# Patient Record
Sex: Female | Born: 1960 | Race: White | Hispanic: No | Marital: Married | Smoking: Never smoker
Health system: Southern US, Community
[De-identification: ages and names within clinical notes are randomized; demographics above are authoritative.]

## PROBLEM LIST (undated history)

## (undated) DIAGNOSIS — G4730 Sleep apnea, unspecified: Secondary | ICD-10-CM

## (undated) DIAGNOSIS — K219 Gastro-esophageal reflux disease without esophagitis: Secondary | ICD-10-CM

## (undated) DIAGNOSIS — R51 Headache: Secondary | ICD-10-CM

## (undated) DIAGNOSIS — M940 Chondrocostal junction syndrome [Tietze]: Secondary | ICD-10-CM

## (undated) DIAGNOSIS — R002 Palpitations: Secondary | ICD-10-CM

## (undated) DIAGNOSIS — I493 Ventricular premature depolarization: Secondary | ICD-10-CM

## (undated) DIAGNOSIS — R202 Paresthesia of skin: Secondary | ICD-10-CM

## (undated) DIAGNOSIS — E669 Obesity, unspecified: Secondary | ICD-10-CM

## (undated) DIAGNOSIS — R112 Nausea with vomiting, unspecified: Secondary | ICD-10-CM

## (undated) DIAGNOSIS — I10 Essential (primary) hypertension: Secondary | ICD-10-CM

## (undated) DIAGNOSIS — E785 Hyperlipidemia, unspecified: Secondary | ICD-10-CM

## (undated) DIAGNOSIS — G589 Mononeuropathy, unspecified: Secondary | ICD-10-CM

## (undated) DIAGNOSIS — M1990 Unspecified osteoarthritis, unspecified site: Secondary | ICD-10-CM

## (undated) DIAGNOSIS — E119 Type 2 diabetes mellitus without complications: Secondary | ICD-10-CM

## (undated) DIAGNOSIS — Z9889 Other specified postprocedural states: Secondary | ICD-10-CM

## (undated) DIAGNOSIS — J45909 Unspecified asthma, uncomplicated: Secondary | ICD-10-CM

## (undated) DIAGNOSIS — R519 Headache, unspecified: Secondary | ICD-10-CM

## (undated) HISTORY — DX: Hyperlipidemia, unspecified: E78.5

## (undated) HISTORY — PX: BREAST MASS EXCISION: SHX1267

## (undated) HISTORY — PX: TUBAL LIGATION: SHX77

## (undated) HISTORY — PX: OTHER SURGICAL HISTORY: SHX169

## (undated) HISTORY — DX: Palpitations: R00.2

## (undated) HISTORY — DX: Mononeuropathy, unspecified: G58.9

## (undated) HISTORY — DX: Paresthesia of skin: R20.2

## (undated) HISTORY — DX: Sleep apnea, unspecified: G47.30

## (undated) HISTORY — DX: Type 2 diabetes mellitus without complications: E11.9

## (undated) HISTORY — DX: Essential (primary) hypertension: I10

## (undated) HISTORY — DX: Ventricular premature depolarization: I49.3

## (undated) HISTORY — DX: Obesity, unspecified: E66.9

## (undated) HISTORY — PX: EYE SURGERY: SHX253

---

## 1999-03-12 ENCOUNTER — Ambulatory Visit (HOSPITAL_COMMUNITY): Admission: RE | Admit: 1999-03-12 | Discharge: 1999-03-12 | Payer: Self-pay | Admitting: *Deleted

## 1999-03-12 ENCOUNTER — Encounter: Payer: Self-pay | Admitting: *Deleted

## 1999-03-16 ENCOUNTER — Ambulatory Visit: Admission: RE | Admit: 1999-03-16 | Discharge: 1999-03-16 | Payer: Self-pay | Admitting: *Deleted

## 1999-10-19 ENCOUNTER — Ambulatory Visit (HOSPITAL_COMMUNITY): Admission: RE | Admit: 1999-10-19 | Discharge: 1999-10-19 | Payer: Self-pay | Admitting: Obstetrics and Gynecology

## 1999-10-19 ENCOUNTER — Encounter (INDEPENDENT_AMBULATORY_CARE_PROVIDER_SITE_OTHER): Payer: Self-pay

## 2003-05-16 ENCOUNTER — Encounter: Payer: Self-pay | Admitting: Emergency Medicine

## 2003-05-16 ENCOUNTER — Emergency Department (HOSPITAL_COMMUNITY): Admission: EM | Admit: 2003-05-16 | Discharge: 2003-05-16 | Payer: Self-pay | Admitting: Emergency Medicine

## 2003-06-24 ENCOUNTER — Other Ambulatory Visit: Admission: RE | Admit: 2003-06-24 | Discharge: 2003-06-24 | Payer: Self-pay | Admitting: Obstetrics and Gynecology

## 2005-02-17 ENCOUNTER — Other Ambulatory Visit: Admission: RE | Admit: 2005-02-17 | Discharge: 2005-02-17 | Payer: Self-pay | Admitting: Obstetrics and Gynecology

## 2007-07-04 ENCOUNTER — Encounter: Admission: RE | Admit: 2007-07-04 | Discharge: 2007-07-04 | Payer: Self-pay | Admitting: Cardiology

## 2007-10-05 ENCOUNTER — Encounter: Admission: RE | Admit: 2007-10-05 | Discharge: 2007-10-05 | Payer: Self-pay | Admitting: Cardiology

## 2009-04-16 ENCOUNTER — Ambulatory Visit (HOSPITAL_COMMUNITY): Admission: RE | Admit: 2009-04-16 | Discharge: 2009-04-16 | Payer: Self-pay | Admitting: Obstetrics and Gynecology

## 2009-04-16 ENCOUNTER — Encounter (INDEPENDENT_AMBULATORY_CARE_PROVIDER_SITE_OTHER): Payer: Self-pay | Admitting: Obstetrics and Gynecology

## 2009-04-21 ENCOUNTER — Encounter: Admission: RE | Admit: 2009-04-21 | Discharge: 2009-04-21 | Payer: Self-pay | Admitting: Obstetrics and Gynecology

## 2009-06-23 ENCOUNTER — Ambulatory Visit (HOSPITAL_COMMUNITY): Admission: RE | Admit: 2009-06-23 | Discharge: 2009-06-23 | Payer: Self-pay | Admitting: Obstetrics and Gynecology

## 2010-08-27 ENCOUNTER — Emergency Department (HOSPITAL_COMMUNITY): Admission: EM | Admit: 2010-08-27 | Discharge: 2010-08-27 | Payer: Self-pay | Admitting: Emergency Medicine

## 2010-08-31 ENCOUNTER — Ambulatory Visit: Payer: Self-pay | Admitting: Cardiology

## 2010-09-07 ENCOUNTER — Encounter: Payer: Self-pay | Admitting: Cardiology

## 2010-09-07 ENCOUNTER — Ambulatory Visit: Payer: Self-pay | Admitting: Cardiology

## 2010-09-07 ENCOUNTER — Ambulatory Visit: Payer: Self-pay

## 2010-09-07 ENCOUNTER — Encounter (HOSPITAL_COMMUNITY): Admission: RE | Admit: 2010-09-07 | Discharge: 2010-10-27 | Payer: Self-pay | Admitting: Cardiology

## 2011-01-25 NOTE — Assessment & Plan Note (Signed)
Summary: Cardiology Nuclear Testing  Nuclear Med Background Indications for Stress Test: Evaluation for Ischemia  Indications Comments: [facility] for [facility] and palpitations  History: Asthma, Echo, GXT  History Comments: .  Symptoms: Chest Pain, DOE, Fatigue, Light-Headedness, Nausea  Symptoms Comments: Last episode of CP 2 days ago. .   Nuclear Pre-Procedure Cardiac Risk Factors: Family History - CAD, Hypertension, Lipids, Obesity Caffeine/Decaff Intake: none NPO After: 7:30 AM Lungs: clear IV 0.9% NS with Angio Cath: 22g     IV Site: R Forearm IV Started by: Cathlyn Parsons, RN Chest Size (in) 44     Cup Size C     Height (in): 64 Weight (lb): 207 BMI: 35.66 Tech Comments: Bystolic held x 24 hours.  Nuclear Med Study 1 or 2 day study:  1 day     Stress Test Type:  Stress Reading MD:  Marca Ancona, MD     Referring MD:  Roger Shelter, MD Resting Radionuclide:  Technetium 70m Tetrofosmin     Resting Radionuclide Dose:  11 mCi  Stress Radionuclide:  Technetium 25m Tetrofosmin     Stress Radionuclide Dose:  33 mCi   Stress Protocol Exercise Time (min):  7:30 min     Max HR:  166 bpm     Predicted Max HR:  170 bpm  Max Systolic BP: 207 mm Hg     Percent Max HR:  97.65 %     METS: 9.5 Rate Pressure Product:  01093    Stress Test Technologist:  Rea College, CMA-N     Nuclear Technologist:  Doyne Keel, CNMT  Rest Procedure  Myocardial perfusion imaging was performed at rest 45 minutes following the intravenous administration of Technetium 17m Tetrofosmin.  Stress Procedure  The patient exercised for 7:30.  The patient stopped due to fatigue and denied any chest pain.  There were no significant ST-T wave changes, only occasional PVC's.  She had a mild hypertensive response to exercise, 207/80.  Technetium 44m Tetrofosmin was injected at peak exercise and myocardial perfusion imaging was performed after a brief delay.  QPS Raw Data Images:  Mild breast attenuation.   Normal left ventricular size. Stress Images:  Normal homogeneous uptake in all areas of the myocardium. Rest Images:  Normal homogeneous uptake in all areas of the myocardium. Subtraction (SDS):  There is no evidence of scar or ischemia. Transient Ischemic Dilatation:  .84  (Normal <1.22)  Lung/Heart Ratio:  .19  (Normal <0.45)  Quantitative Gated Spect Images QGS EDV:  86 ml QGS ESV:  33 ml QGS EF:  62 % QGS cine images:  Normal wall motion.   Overall Impression  Exercise Capacity: Fair exercise capacity. BP Response: Hypertensive blood pressure response. Clinical Symptoms: Fatigue, no chest pain.  ECG Impression: No significant ST segment change suggestive of ischemia. Overall Impression: Normal stress nuclear study.

## 2011-03-10 LAB — POCT CARDIAC MARKERS
CKMB, poc: <1 ng/mL — ABNORMAL LOW (ref 1.0–8.0)
CKMB, poc: <1 ng/mL — ABNORMAL LOW (ref 1.0–8.0)
Myoglobin, poc: 26 ng/mL (ref 12–200)
Troponin i, poc: <0.05 ng/mL (ref 0.00–0.09)

## 2011-03-10 LAB — BASIC METABOLIC PANEL
BUN: 10 mg/dL (ref 6–23)
Chloride: 109 mEq/L (ref 96–112)
Potassium: 3.4 mEq/L — ABNORMAL LOW (ref 3.5–5.1)
Sodium: 140 mEq/L (ref 135–145)

## 2011-03-10 LAB — CBC
HCT: 37.1 % (ref 36.0–46.0)
Hemoglobin: 13 g/dL (ref 12.0–15.0)
MCV: 81.7 fL (ref 78.0–100.0)
RDW: 14.9 % (ref 11.5–15.5)
WBC: 7.2 10*3/uL (ref 4.0–10.5)

## 2011-04-04 LAB — CBC
MCHC: 33.4 g/dL (ref 30.0–36.0)
MCV: 74.3 fL — ABNORMAL LOW (ref 78.0–100.0)
Platelets: 271 10*3/uL (ref 150–400)
RBC: 4.51 MIL/uL (ref 3.87–5.11)
RDW: 16.8 % — ABNORMAL HIGH (ref 11.5–15.5)

## 2011-04-04 LAB — HCG, SERUM, QUALITATIVE: Preg, Serum: NEGATIVE

## 2011-04-06 LAB — HCG, SERUM, QUALITATIVE: Preg, Serum: NEGATIVE

## 2011-04-06 LAB — CBC
Platelets: 263 10*3/uL (ref 150–400)
RDW: 15.3 % (ref 11.5–15.5)
WBC: 6.3 10*3/uL (ref 4.0–10.5)

## 2011-05-10 NOTE — Op Note (Signed)
NAME:  Norma [name], Norma [name]               ACCOUNT NO.:  [id number]   MEDICAL RECORD NO.:  [id number]          PATIENT TYPE:  AMB   LOCATION:  SDC                           FACILITY:  [facility]   PHYSICIAN:  Juluis Mire, M.D.   DATE OF BIRTH:  [birth year]   DATE OF PROCEDURE:  04/16/2009  DATE OF DISCHARGE:                               OPERATIVE REPORT   PREOPERATIVE DIAGNOSIS:  Endometrial polyp.   POSTOPERATIVE DIAGNOSIS:  Endometrial polyp.   PROCEDURES:  1. Hysteroscopy.  2. Resection of endometrial polyps.  3. Endometrial curettings.   SURGEON:  Juluis Mire, M.D.   ANESTHESIA:  General.   ESTIMATED BLOOD LOSS:  110 mL   PACKS AND DRAINS:  None.   INTRAOPERATIVE BLOOD PLACED:  None.   COMPLICATIONS:  None.   INDICATIONS:  As dictated history and physical.   DESCRIPTION OF PROCEDURE:  The patient was taken to OR, placed supine  position.  After satisfactory level of general anesthesia was obtained,  the patient was placed in dorsal lithotomy position using the Allen  stirrups.  The perineum, vagina were prepped out with Betadine and  draped in sterile field.  A speculum was  placed in vaginal vault.  The  cervix grasped with single-tooth tenaculum.  A paracervical block of 1%  Xylocaine was instituted.  Uterus sounded to 9 cm.  Cervix serially  dilated to a size of 31 Pratt dilator.  Operative scope was then  introduced.  Intrauterine cavity was distended using sorbitol.  Visualization revealed several posterior wall polyps.  These were  resected and sent for Pathology.  We then obtained multiple endometrial  biopsies along with curettings.  Total deficit was minimal.  There was  no signs of perforation or complications.  No active bleeding was noted.  The  single-tooth tenaculum and speculum removed.  The patient was taken out  of dorsal lithotomy position, once alert and extubated, transferred to  recovery room in good condition.  Sponge, instrument, and needle count  was correct by circulating nurse.      Juluis Mire, M.D.  Electronically Signed     JSM/MEDQ  D:  04/16/2009  T:  04/17/2009  Job:  [id number]

## 2011-05-10 NOTE — H&P (Signed)
NAME:  Norma [name], Norma [name]               ACCOUNT NO.:  [id number]   MEDICAL RECORD NO.:  [id number]          PATIENT TYPE:  AMB   LOCATION:  SDC                           FACILITY:  [facility]   PHYSICIAN:  Juluis Mire, M.D.   DATE OF BIRTH:  Dec 15, 1961   DATE OF ADMISSION:  06/23/2009  DATE OF DISCHARGE:  06/23/2009                              HISTORY & PHYSICAL   The patient is a 50 year old gravida 4, para 4 female, who presents for  laparoscopic bilateral tubal ligation.  She desires permanent  sterilization.  Alternative forms of birth control have been discussed.  Potential irreversibility of sterilization explained.  A failure rate of  1:200 was quoted; this can be in the form of ectopic pregnancy requiring  further surgical management.   In terms of allergy, she is allergic to CODEINE.   MEDICATIONS:  1. She is on Bystolic 2.5 mg daily.  2. Simvastatin 20 mg daily.  3. Fenofibrate 160 mg daily.  4. Gabapentin 600 mg 2-3 times a day.  5. Aspirin once daily.   PAST MEDICAL HISTORY:  She is being actively managed for hypertension as  well as hypercholesterolemia.  Does have a history of cardiac  arrhythmias.   PAST SURGICAL HISTORY:  She had eye surgery in 1977, had a C-section in  1995, and she also had a previous hysteroscopy in April of this year.   OBSTETRICAL HISTORY:  Three vaginal deliveries.   FAMILY HISTORY:  Noncontributory.   SOCIAL HISTORY:  Reveals no tobacco or alcohol use.   REVIEW OF SYSTEMS:  Noncontributory.   PHYSICAL EXAMINATION:  GENERAL:  The patient is afebrile.  VITAL SIGNS:  Stable.  HEENT:  The patient is normocephalic.  Pupils equal, round, and reactive  to light and accommodation.  Extraocular movements were intact.  Sclerae  and conjunctivae are clear.  Oropharynx clear.  NECK:  Without thyromegaly.  BREASTS:  Not examined.  LUNGS:  Clear.  CARDIAC SYSTEM:  Regular rhythm and rate without murmurs or gallops.  ABDOMEN:  Benign.  No mass,  organomegaly, or tenderness.  PELVIC:  Normal external genitalia.  Vaginal mucosa clear.  Cervix  unremarkable.  Uterus normal size, shape, and contour.  Adnexa free of  masses or tenderness.  EXTREMITIES:  Trace edema.  NEUROLOGIC:  Grossly within normal limits.   IMPRESSION:  Multiparity, desires sterility.   PLAN:  The patient will undergo laparoscopic bilateral tubal  fulguration.  The nature of surgery have been discussed.  The risks have  been explained including the risk of infection.  The risk of hemorrhage  that could require transfusion, the risk of AIDS or hepatitis.  Risk of  injury to the adjacent organs including bladder, bowel, ureters that  could require further exploratory surgery.  Risk of deep venous  thrombosis, pulmonary embolus.  The patient does understand potential  risk and indication.       Juluis Mire, M.D.  Electronically Signed     JSM/MEDQ  D:  06/23/2009  T:  06/24/2009  Job:  [id number]

## 2011-05-10 NOTE — Op Note (Signed)
Norma [name], Norma [name]               ACCOUNT NO.:  [id number]   MEDICAL RECORD NO.:  [id number]          PATIENT TYPE:  AMB   LOCATION:  SDC                           [facility]ACILITY:  [facility]   PHYSICIAN:  Juluis Mire, M.D.   DATE OF BIRTH:  [birth year]   DATE OF PROCEDURE:  06/23/2009  DATE OF DISCHARGE:  06/23/2009                               OPERATIVE REPORT   PREOPERATIVE DIAGNOSIS:  Multiparity, desires sterility.   POSTOPERATIVE DIAGNOSIS:  Multiparity, desires sterility.   OPERATIVE PROCEDURE:  Laparoscopic bilateral tubal fulguration.   SURGEON:  Juluis Mire, MD   ANESTHESIA:  General.   ESTIMATED BLOOD LOSS:  Minimal.   PACKS AND DRAINS:  None.   INTRAOPERATIVE BLOOD PLACED:  None.   COMPLICATIONS:  None.   INDICATIONS:  Dictated in the history and physical.   PROCEDURE IN DETAIL:  The patient was taken to the OR, placed supine  position.  After satisfactory level of general endotracheal anesthesia  was obtained, the patient was placed in the dorsal lithotomy position  using the Allen stirrups.  The abdomen, perineum, and vagina were  prepped out with Betadine.  Bladder was emptied in-and-out  catheterization.  A Hulka tenaculum was put in place and secured.  The  patient was then draped in sterile field.  A subumbilical incision was  made with a knife.  Veress needle was introduced into the abdominal  cavity.  Abdomen was inflated with approximately 3-1/2 L of carbon  dioxide.  Operating laparoscope was introduced.  Visualization revealed  no evidence of injury to adjacent organs.  A 5-mm trocar was put in  place to the suprapubic area.  The patient was placed in Trendelenburg  position.  Uterus was elevated.  Using a blunt probe, we were able to  identify both tubes.  Both ovaries appeared to be normal.  Left tube had  a touch of endometriosis and a small adhesion to the left pelvic  sidewall.  The uterus was upper limits of normal size.  The appendix was  visualized and noted to be normal.  Upper abdomen including the liver  and tip of the gallbladder were clear.  Using the bipolar, a 3-cm  segment of each tube was cauterized.  Cautery was continued until  resistance read zero on the meter.  The same segment of tube was then  recauterized completely desiccating the tube.  Both tubes were  adequately cauterized with cauterized area of endometriosis on the left  ovary.  At this point in time, we had good hemostasis.  No evidence of  injury to adjacent organs.  The abdomen was deflated with carbon  dioxide.  All trocars removed.  Subumbilical incision was closed with  interrupted subcuticulars of 4-0 Vicryl.  The suprapubic incisions were  closed with Dermabond.  The  Hulka tenaculum was then removed.  The patient was taken out of the  dorsal lithotomy position.  Once alert and extubated, transferred to  recovery room in good condition.  Sponge, instrument, and needle count  report was correct by circulating nurse x2.      Jonny Ruiz  Lisbeth Ply, M.D.  Electronically Signed     JSM/MEDQ  D:  06/23/2009  T:  06/24/2009  Job:  [id number]

## 2011-05-10 NOTE — H&P (Signed)
NAME:  Norma [name], Norma [name]               ACCOUNT NO.:  [id number]   MEDICAL RECORD NO.:  [id number]          PATIENT TYPE:  AMB   LOCATION:  SDC                           [facility]ILITY[facility]   PHYSICIAN:  Juluis Mire, M.D.   DATE OF BIRTH:  10-26-1961   DATE OF ADMISSION:  04/16/2009  DATE OF DISCHARGE:                              HISTORY & PHYSICAL   The patient is a 50 year old gravida 4, para 4 female, presents for  hysteroscopy.   The patient was seen in March 2010 with increasing menorrhagia,  underwent a saline infusion.  Ultrasound revealed a large endometrial  polyp, now presents for hysteroscopic resection.   ALLERGIES:  In terms of allergies, allergic to CODEINE.   MEDICATIONS:  1. Bystolic 2.5 mg daily.  2. Simvastatin 20 mg daily.  3. Fenofibrate 160 mg daily.  4. Gabapentin 600 mg 2-3 times daily.  5. Aspirin once daily.   PAST MEDICAL HISTORY:  Significant that she is being actively managed  for hypertension as well as hypercholesterolemia, also a history of  cardiac arrhythmia.   PAST SURGICAL HISTORY:  She has had eyes surgery in 1977 and a C-section  in 1995.   OBSTETRICAL HISTORY:  Three vaginal deliveries.   FAMILY HISTORY:  Noncontributory.   SOCIAL HISTORY:  No tobacco or alcohol use.   REVIEW OF SYSTEMS:  Noncontributory.   PHYSICAL EXAMINATION:  GENERAL:  The patient is afebrile.  VITAL SIGNS:  Stable.  HEENT:  The patient is normocephalic.  Pupils are equal, round, and  reactive to light and accommodation.  Extraocular movements were intact.  Sclerae and conjunctivae are clear.  Oropharynx is clear.  NECK:  Without thyromegaly.  BREASTS:  No discrete masses.  LUNGS:  Clear.  CARDIOVASCULAR:  Regular rate without murmurs or gallops.  ABDOMEN:  Benign.  No mass, organomegaly or tenderness.  PELVIC:  Normal external genitalia.  Vaginal mucosa is clear.  Cervix  unremarkable.  Uterus normal size, shape, and contour.  Adnexa free of  masses or  tenderness.  EXTREMITIES:  Trace edema.  NEUROLOGIC:  Grossly within normal limits.   IMPRESSION:  1. Menorrhagia with associated endometrial polyp.  2. History of cardiac arrhythmias.  3. Hypercholesterolemia.  4. Hypertension.   PLAN:  The patient will undergo the above-noted surgery.  The risk of  surgery have been discussed including the risk of infection.  The risk  of hemorrhage that could require transfusion with the risk of AIDS or  hepatitis.  Risk of injury to adjacent organs including bladder, bowel,  and ureters could require further exploratory surgery.  Risk of deep  venous thrombosis and pulmonary embolus.  The patient expressed  understanding potential risk and complication.      Juluis Mire, M.D.  Electronically Signed     JSM/MEDQ  D:  04/16/2009  T:  04/16/2009  Job:  [id number]

## 2011-09-05 ENCOUNTER — Telehealth: Payer: Self-pay | Admitting: Nurse Practitioner

## 2011-09-05 MED ORDER — SIMVASTATIN 20 MG PO TABS: 20.0000 mg | ORAL_TABLET | Freq: Every evening | ORAL | Status: DC

## 2011-09-05 NOTE — Telephone Encounter (Signed)
Pt needs refill on Simbastatin to be refilled at [facility] on [address].  Pt would like a call back to confirm this has been done.  Chart in box.

## 2011-10-31 ENCOUNTER — Other Ambulatory Visit: Payer: Self-pay | Admitting: Cardiology

## 2011-11-08 ENCOUNTER — Encounter: Payer: Self-pay | Admitting: *Deleted

## 2011-11-08 ENCOUNTER — Encounter: Payer: Self-pay | Admitting: Cardiology

## 2011-11-10 ENCOUNTER — Encounter: Payer: Self-pay | Admitting: Cardiovascular Disease

## 2011-11-14 ENCOUNTER — Telehealth: Payer: Self-pay | Admitting: Nurse Practitioner

## 2011-11-14 NOTE — Telephone Encounter (Signed)
New problem She said she has been having pain in her shoulder blades and palpatations has gotten worse. She said she was hurting last week and went away and came back yesterday. She is having moderate pain now

## 2011-11-14 NOTE — Telephone Encounter (Signed)
Told pt to go to urgent care or ER to be evaluated for shoulder pain.  She states she is not having the palpitations now.

## 2011-11-18 ENCOUNTER — Other Ambulatory Visit: Payer: Self-pay | Admitting: Nurse Practitioner

## 2011-12-07 ENCOUNTER — Ambulatory Visit (INDEPENDENT_AMBULATORY_CARE_PROVIDER_SITE_OTHER): Payer: Managed Care, Other (non HMO) | Admitting: Cardiovascular Disease

## 2011-12-07 ENCOUNTER — Encounter: Payer: Self-pay | Admitting: Cardiovascular Disease

## 2011-12-07 DIAGNOSIS — R42 Dizziness and giddiness: Secondary | ICD-10-CM

## 2011-12-07 DIAGNOSIS — J069 Acute upper respiratory infection, unspecified: Secondary | ICD-10-CM

## 2011-12-07 DIAGNOSIS — I493 Ventricular premature depolarization: Secondary | ICD-10-CM | POA: Insufficient documentation

## 2011-12-07 DIAGNOSIS — E782 Mixed hyperlipidemia: Secondary | ICD-10-CM | POA: Insufficient documentation

## 2011-12-07 DIAGNOSIS — R0602 Shortness of breath: Secondary | ICD-10-CM

## 2011-12-07 MED ORDER — SIMVASTATIN 20 MG PO TABS: 20.0000 mg | ORAL_TABLET | Freq: Every evening | ORAL | Status: DC

## 2011-12-07 MED ORDER — NEBIVOLOL HCL 2.5 MG PO TABS: 2.5000 mg | ORAL_TABLET | Freq: Every day | ORAL | Status: DC

## 2011-12-07 NOTE — Patient Instructions (Signed)
Your physician recommends that you schedule a follow-up appointment as needed with Dr. Nishan. 

## 2011-12-07 NOTE — Assessment & Plan Note (Signed)
?   Indication for gabepentan  Ecnouraged her to F/U with neuro and wean this.

## 2011-12-07 NOTE — Assessment & Plan Note (Signed)
Stable continue bystolic no evidence of structural heart disease

## 2011-12-07 NOTE — Assessment & Plan Note (Signed)
F/U primary  No need for antibiotics at this time  Symptomatic care

## 2011-12-07 NOTE — Progress Notes (Signed)
Patient ID: Norma [name], female   DOB: 03/25/1961, 50 y.o.   MRN: [id number] 50 yo of Dr Deborah Chalk.  History of PVC;s , and elevated lipids.  On gabepentan for dizzyness.  Symptoms improved on bystolic.  Normal myovue 11/11.  No SSCP.  Discussed diet and activity level as she is overweight.  Recent URI last 11 days.  No fever but congested with cough.  No SSCP.  Palpitaitons infrequent.  No presyncope.  No previous evidence of structural heart disease.  Has primary Dr Herbert Moors  ROS: Denies fever, malais, weight loss, blurry vision, decreased visual acuity, cough, sputum, SOB, hemoptysis, pleuritic pain, palpitaitons, heartburn, abdominal pain, melena, lower extremity edema, claudication, or rash.  All other systems reviewed and negative  General: Affect appropriate Healthy:  appears stated age HEENT: normal Neck supple with no adenopathy JVP normal no bruits no thyromegaly Lungs clear with no wheezing and good diaphragmatic motion Heart:  S1/S2 no murmur,rub, gallop or click PMI normal Abdomen: benighn, BS positve, no tenderness, no AAA no bruit.  No HSM or HJR Distal pulses intact with no bruits No edema Neuro non-focal Skin warm and dry No muscular weakness   Current Outpatient Prescriptions  Medication Sig Dispense Refill  . aspirin 81 MG tablet Take 81 mg by mouth daily.        . Chlorphen-Pseudoephed-APAP (CORICIDIN D PO) Take by mouth as needed.        . fenofibrate 160 MG tablet TAKE 1 TABLET EVERY DAY  15 tablet  0  . gabapentin (NEURONTIN) 600 MG tablet Take 600 mg by mouth daily.        Marland Kitchen ibuprofen (ADVIL,MOTRIN) 200 MG tablet Take 200 mg by mouth every 6 (six) hours as needed.        . nebivolol (BYSTOLIC) 2.5 MG tablet Take 2.5 mg by mouth daily.        . simvastatin (ZOCOR) 20 MG tablet Take 1 tablet (20 mg total) by mouth every evening.  90 tablet  0    Allergies  Codeine and Keflex  Electrocardiogram:  NSR rate65  nonspecfic inferolateral T wave changes  Assessment  and Plan

## 2011-12-07 NOTE — Assessment & Plan Note (Signed)
Cholesterol is at goal.  Continue current dose of statin and diet Rx.  No myalgias or side effects.  F/U  LFT's in 6 months. No results found for this basename: LDLCALC  Labs with primary            

## 2011-12-08 ENCOUNTER — Telehealth: Payer: Self-pay | Admitting: Cardiovascular Disease

## 2011-12-08 MED ORDER — NEBIVOLOL HCL 2.5 MG PO TABS: 2.5000 mg | ORAL_TABLET | Freq: Every day | ORAL | Status: DC

## 2011-12-08 NOTE — Telephone Encounter (Signed)
bystolic was called in as qty 30 instead of 30 days upply she takes two a day , and fenofibrate was  Not called in at all, uses [facility] [address]

## 2011-12-10 ENCOUNTER — Other Ambulatory Visit: Payer: Self-pay | Admitting: Nurse Practitioner

## 2011-12-16 ENCOUNTER — Telehealth: Payer: Self-pay | Admitting: Cardiovascular Disease

## 2011-12-16 MED ORDER — NEBIVOLOL HCL 2.5 MG PO TABS: 2.5000 mg | ORAL_TABLET | Freq: Two times a day (BID) | ORAL | Status: DC

## 2011-12-16 NOTE — Telephone Encounter (Signed)
PER PT  DR TENNANT INCREASED BYSTOLIC 2.5 MG  TO BID  NEW SCRIPT SENT VIA EPIC TO PHARMACY .Zack Seal

## 2011-12-16 NOTE — Telephone Encounter (Signed)
New msg Pt is calling saying that her bystolic has not [facility] [address]. She wants to clarify this dose. She takes two a day instead of on per day Please call

## 2013-02-21 ENCOUNTER — Encounter: Payer: Self-pay | Admitting: Cardiology

## 2013-02-22 ENCOUNTER — Other Ambulatory Visit: Payer: Self-pay

## 2013-02-22 ENCOUNTER — Encounter: Payer: Self-pay | Admitting: Nurse Practitioner

## 2013-02-22 ENCOUNTER — Ambulatory Visit (INDEPENDENT_AMBULATORY_CARE_PROVIDER_SITE_OTHER): Payer: Managed Care, Other (non HMO) | Admitting: Nurse Practitioner

## 2013-02-22 VITALS — BP 144/72 | HR 62 | Ht 64.0 in | Wt 222.0 lb

## 2013-02-22 DIAGNOSIS — I10 Essential (primary) hypertension: Secondary | ICD-10-CM

## 2013-02-22 LAB — BASIC METABOLIC PANEL
BUN: 15 mg/dL (ref 6–23)
CO2: 27 mEq/L (ref 19–32)
Calcium: 9.1 mg/dL (ref 8.4–10.5)
Chloride: 104 mEq/L (ref 96–112)
Creatinine, Ser: 0.6 mg/dL (ref 0.4–1.2)
GFR: 113.55 mL/min (ref >60.00)
Glucose, Bld: 110 mg/dL — ABNORMAL HIGH (ref 70–99)
Potassium: 4.5 mEq/L (ref 3.5–5.1)
Sodium: 138 mEq/L (ref 135–145)

## 2013-02-22 MED ORDER — LISINOPRIL 5 MG PO TABS: 10.0000 mg | ORAL_TABLET | Freq: Every day | ORAL | Status: DC

## 2013-02-22 MED ORDER — NEBIVOLOL HCL 10 MG PO TABS: 10.0000 mg | ORAL_TABLET | Freq: Two times a day (BID) | ORAL | Status: DC

## 2013-02-22 NOTE — Patient Instructions (Addendum)
Stay on your current medicines  I have refilled the Bystolic today  I am adding Lisinopril 10 mg a day - this is also for your blood pressure  Monitor your blood pressure at home.   Record your readings and bring to your next visit.  Limit sodium intake to less than 2000 mg a day  We need to check lab today and when you come back  I will see you in a month  Call the [facility] office at [phone] if you have any questions, problems or concerns.

## 2013-02-22 NOTE — Progress Notes (Signed)
Norma [name] Date of Birth: [birth year] Medical Record #[id number]  History of Present Illness: Norma [name] is seen back today for a follow up visit. She is seen for Dr. Eden Emms. She is a former patient of Dr. Ronnald Nian. Last seen here in December of 2012. She has a history of chronic obesity, HTN, mild sleep apnea,  HLD, palpitations and documented PVC's. She does have mild LVH with last echo in 2007. Normal Myoview back in 2011.   She comes in today. She is here alone. She is doing ok. She says she was told to not come back here. Has basically done ok. Not exercising. Continues to gain weight. Was at the Urgent Care up at [facility] this past Friday with chest pain. It hurt to lie on her left side. She had an EKG - told it was normal. Started back on statin but she did not pick up the prescription. She took Ibuprofen with relief. BP at home has been running in the 150's systolic over the last year. She is not dizzy or lightheaded. Still has some occasional palpitations.  Current Outpatient Prescriptions on File Prior to Visit  Medication Sig Dispense Refill  . aspirin 81 MG tablet Take 81 mg by mouth daily.        Marland Kitchen gabapentin (NEURONTIN) 600 MG tablet Take 600 mg by mouth daily.        Marland Kitchen ibuprofen (ADVIL,MOTRIN) 200 MG tablet Take 200 mg by mouth every 6 (six) hours as needed.         No current facility-administered medications on file prior to visit.    Allergies  Allergen Reactions  . Cephalexin   . Codeine     Past Medical History  Diagnosis Date  . HTN (hypertension)   . Obesity   . Hyperlipidemia   . Palpitations   . PVC (premature ventricular contraction)   . Sleep apnea     History reviewed. No pertinent past surgical history.  History  Smoking status  . Never Smoker   Smokeless tobacco  . Not on file    History  Alcohol Use No    History reviewed. No pertinent family history.  Review of Systems: The review of systems is per the HPI.  All other systems  were reviewed and are negative.  Physical Exam: BP 144/72  Pulse 62  Ht 5\' 4"  (1.626 m)  Wt 222 lb (100.699 kg)  BMI 38.09 kg/m2 Patient is pleasant and in no acute distress. She is obese. Skin is warm and dry. Color is normal.  HEENT is unremarkable. Normocephalic/atraumatic. PERRL. Sclera are nonicteric. Neck is supple. No masses. No JVD. Lungs are clear. Cardiac exam shows a regular rate and rhythm. Abdomen is soft. Extremities are without edema. Gait and ROM are intact. No gross neurologic deficits noted.   LABORATORY DATA: BMET is pending  Lab Results  Component Value Date   WBC 7.2 08/27/2010   HGB 13.0 08/27/2010   HCT 37.1 08/27/2010   PLT 192 08/27/2010   GLUCOSE 132* 08/27/2010   NA 140 08/27/2010   K 3.4* 08/27/2010   CL 109 08/27/2010   CREATININE 0.64 08/27/2010   BUN 10 08/27/2010   CO2 23 08/27/2010     Assessment / Plan: 1. HTN - BP is above goal. I have added Lisinopril 10 mg a day. I will see her back in a month with a repeat BMET. Check BMET today.   2. HLD - will more than likely need to get back  on statin. She has not tolerated in the past.   3. Obesity - this is the crux of her issues. We have talked about this in depth today. Making her health a priority is crucial for doing well long term.   4. PVC's - chronic issue - does not seem to be any worse.   5. Atypical chest pain - resolved - sounds like it was more musculoskeletal.   I will see her back in a month.  Patient is agreeable to this plan and will call if any problems develop in the interim.

## 2013-03-22 ENCOUNTER — Other Ambulatory Visit: Payer: Managed Care, Other (non HMO)

## 2013-03-22 ENCOUNTER — Ambulatory Visit: Payer: Managed Care, Other (non HMO) | Admitting: Nurse Practitioner

## 2013-04-17 ENCOUNTER — Encounter: Payer: Self-pay | Admitting: Nurse Practitioner

## 2013-09-16 ENCOUNTER — Other Ambulatory Visit: Payer: Self-pay | Admitting: Neurology

## 2013-09-16 DIAGNOSIS — G43909 Migraine, unspecified, not intractable, without status migrainosus: Secondary | ICD-10-CM

## 2013-09-25 ENCOUNTER — Other Ambulatory Visit: Payer: Managed Care, Other (non HMO)

## 2013-09-29 ENCOUNTER — Ambulatory Visit
Admission: RE | Admit: 2013-09-29 | Discharge: 2013-09-29 | Disposition: A | Discharge status: Home / Self Care | Payer: Managed Care, Other (non HMO) | Source: Ambulatory Visit | Attending: Neurology | Admitting: Neurology

## 2013-09-29 DIAGNOSIS — G43909 Migraine, unspecified, not intractable, without status migrainosus: Secondary | ICD-10-CM

## 2013-10-31 ENCOUNTER — Other Ambulatory Visit: Payer: Self-pay

## 2014-04-08 ENCOUNTER — Other Ambulatory Visit (HOSPITAL_COMMUNITY)
Admission: RE | Admit: 2014-04-08 | Discharge: 2014-04-08 | Disposition: A | Discharge status: Home / Self Care | Payer: Managed Care, Other (non HMO) | Source: Ambulatory Visit | Attending: Family Medicine | Admitting: Family Medicine

## 2014-04-08 ENCOUNTER — Other Ambulatory Visit: Payer: Self-pay | Admitting: Family Medicine

## 2014-04-08 DIAGNOSIS — Z01419 Encounter for gynecological examination (general) (routine) without abnormal findings: Secondary | ICD-10-CM | POA: Insufficient documentation

## 2016-08-26 NOTE — Progress Notes (Signed)
Pt is being scheduled for preop appt. Please place surgical orders in epic. Thanks.  

## 2016-09-08 ENCOUNTER — Ambulatory Visit: Payer: Self-pay | Admitting: Surgery

## 2016-09-20 ENCOUNTER — Encounter (HOSPITAL_COMMUNITY): Payer: Self-pay

## 2016-09-20 NOTE — Patient Instructions (Addendum)
Norma [name]  09/20/2016   Your procedure is scheduled on: 09/23/2016    [facility]  Entrance [place]  elevators to 3rd floor to  Short Stay Center at    1015 AM.  Call this number if you have problems the morning of surgery [phone]   Remember: ONLY 1 PERSON MAY GO WITH YOU TO SHORT STAY TO GET  READY MORNING OF YOUR SURGERY.  Do not eat food or drink liquids :After Midnight.     Take these medicines the morning of surgery with A SIP OF WATER: Albuterol Inhaler if needed and bring,                                 You may not have any metal on your body including hair pins and              piercings  Do not wear jewelry, make-up, lotions, powders or perfumes, deodorant             Do not wear nail polish.  Do not shave  48 hours prior to surgery.                 Do not bring valuables to the hospital. [facility] IS NOT             RESPONSIBLE   FOR VALUABLES.  Contacts, dentures or bridgework may not be worn into surgery.      Patients discharged the day of surgery will not be allowed to drive home.  Name and phone number of your driver:  Special Instructions: N/A              Please read over the following fact sheets you were given: _____________________________________________________________________             [facility] - Preparing [facility] Before surgery, you can play an important role.  Because skin is not sterile, your skin needs to be as free of germs as possible.  You can reduce the number of germs on your skin by washing with CHG (chlorahexidine gluconate) soap before surgery.  CHG is an antiseptic cleaner which kills germs and bonds with the skin to continue killing germs even after washing. Please DO NOT use if you have an allergy to CHG or antibacterial soaps.  If your skin becomes reddened/irritated stop using the CHG and inform your nurse when you arrive at Short Stay. Do not shave (including legs and  underarms) for at least 48 hours prior to the first CHG shower.  You may shave your face/neck. Please follow these instructions carefully:  1.  Shower with CHG Soap the night before surgery and the  morning of Surgery.  2.  If you choose to wash your hair, wash your hair first as usual with your  normal  shampoo.  3.  After you shampoo, rinse your hair and body thoroughly to remove the  shampoo.                           4.  Use CHG as you would any other liquid soap.  You can apply chg directly  to the skin and wash  Gently with a scrungie or clean washcloth.  5.  Apply the CHG Soap to your body ONLY FROM THE NECK DOWN.   Do not use on face/ open                           Wound or open sores. Avoid contact with eyes, ears mouth and genitals (private parts).                       Wash face,  Genitals (private parts) with your normal soap.             6.  Wash thoroughly, paying special attention to the area where your surgery  will be performed.  7.  Thoroughly rinse your body with warm water from the neck down.  8.  DO NOT shower/wash with your normal soap after using and rinsing off  the CHG Soap.                9.  Pat yourself dry with a clean towel.            10.  Wear clean pajamas.            11.  Place clean sheets on your bed the night of your first shower and do not  sleep with pets. Day of Surgery : Do not apply any lotions/deodorants the morning of surgery.  Please wear clean clothes to the hospital/surgery center.  FAILURE TO FOLLOW THESE INSTRUCTIONS MAY RESULT IN THE CANCELLATION OF YOUR SURGERY PATIENT SIGNATURE_________________________________  NURSE SIGNATURE__________________________________  ________________________________________________________________________

## 2016-09-21 ENCOUNTER — Encounter (HOSPITAL_COMMUNITY)
Admission: RE | Admit: 2016-09-21 | Discharge: 2016-09-21 | Disposition: A | Discharge status: Home / Self Care | Payer: Managed Care, Other (non HMO) | Source: Ambulatory Visit | Attending: Surgery | Admitting: Surgery

## 2016-09-21 ENCOUNTER — Encounter (HOSPITAL_COMMUNITY): Payer: Self-pay

## 2016-09-21 DIAGNOSIS — G4730 Sleep apnea, unspecified: Secondary | ICD-10-CM | POA: Diagnosis not present

## 2016-09-21 DIAGNOSIS — K8010 Calculus of gallbladder with chronic cholecystitis without obstruction: Secondary | ICD-10-CM | POA: Diagnosis not present

## 2016-09-21 DIAGNOSIS — K219 Gastro-esophageal reflux disease without esophagitis: Secondary | ICD-10-CM | POA: Diagnosis not present

## 2016-09-21 DIAGNOSIS — Z79899 Other long term (current) drug therapy: Secondary | ICD-10-CM | POA: Diagnosis not present

## 2016-09-21 DIAGNOSIS — E7800 Pure hypercholesterolemia, unspecified: Secondary | ICD-10-CM | POA: Diagnosis not present

## 2016-09-21 DIAGNOSIS — J45909 Unspecified asthma, uncomplicated: Secondary | ICD-10-CM | POA: Diagnosis not present

## 2016-09-21 DIAGNOSIS — K811 Chronic cholecystitis: Secondary | ICD-10-CM | POA: Diagnosis present

## 2016-09-21 DIAGNOSIS — I10 Essential (primary) hypertension: Secondary | ICD-10-CM | POA: Diagnosis not present

## 2016-09-21 DIAGNOSIS — G43909 Migraine, unspecified, not intractable, without status migrainosus: Secondary | ICD-10-CM | POA: Diagnosis not present

## 2016-09-21 DIAGNOSIS — Z7982 Long term (current) use of aspirin: Secondary | ICD-10-CM | POA: Diagnosis not present

## 2016-09-21 HISTORY — DX: Gastro-esophageal reflux disease without esophagitis: K21.9

## 2016-09-21 HISTORY — DX: Unspecified asthma, uncomplicated: J45.909

## 2016-09-21 HISTORY — DX: Chondrocostal junction syndrome (tietze): M94.0

## 2016-09-21 HISTORY — DX: Other specified postprocedural states: Z98.890

## 2016-09-21 HISTORY — DX: Nausea with vomiting, unspecified: R11.2

## 2016-09-21 HISTORY — DX: Headache, unspecified: R51.9

## 2016-09-21 HISTORY — DX: Headache: R51

## 2016-09-21 LAB — CBC
HCT: 41 % (ref 36.0–46.0)
Hemoglobin: 13.8 g/dL (ref 12.0–15.0)
MCH: 27.6 pg (ref 26.0–34.0)
MCHC: 33.7 g/dL (ref 30.0–36.0)
MCV: 82 fL (ref 78.0–100.0)
PLATELETS: 233 10*3/uL (ref 150–400)
RBC: 5 MIL/uL (ref 3.87–5.11)
RDW: 14.6 % (ref 11.5–15.5)
WBC: 9.1 10*3/uL (ref 4.0–10.5)

## 2016-09-21 LAB — BASIC METABOLIC PANEL
Anion gap: 8 (ref 5–15)
BUN: 16 mg/dL (ref 6–20)
CALCIUM: 9.6 mg/dL (ref 8.9–10.3)
CO2: 25 mmol/L (ref 22–32)
CREATININE: 0.42 mg/dL — AB (ref 0.44–1.00)
Chloride: 107 mmol/L (ref 101–111)
GFR calc non Af Amer: >60 mL/min (ref >60)
Glucose, Bld: 95 mg/dL (ref 65–99)
Potassium: 4 mmol/L (ref 3.5–5.1)
SODIUM: 140 mmol/L (ref 135–145)

## 2016-09-21 NOTE — Progress Notes (Signed)
Final EKg done 09/21/16 in [facility].

## 2016-09-22 ENCOUNTER — Encounter (HOSPITAL_COMMUNITY): Payer: Self-pay

## 2016-09-22 NOTE — H&P (Signed)
Rogelio Seen Location: [place] Office Patient #: [id number] DOB: 09/09/1961 Married / Language: English / Race: White Female   History of Present Illness  Patient words: New-gallbladder.  The patient is a 55 year old female who presents for evaluation of gall stones. The onset of the gall stones has been acute and has been occurring for 3 hours. The course has been recurrent. The gall stones is described as moderate. There has been associated heartburn. She saw her [facility] primary who obtained ultrasound which showed gallstones. She thinks that her symptoms have been occurring frequently but range from brief and minimal to the symptoms that made her sick a couple of weeks ago.   I described lap chole to her including complications not limited to CBD injury, bowel injury, or bile leaks. She wants to get this scheduled as soon as possible.    Other Problems  Asthma Back Pain Bladder Problems Chest pain Cholelithiasis Gastroesophageal Reflux Disease High blood pressure Hypercholesterolemia Migraine Headache Sleep Apnea  Past Surgical History  Cesarean Section - 1  Diagnostic Studies History  Colonoscopy never Mammogram >3 years ago Pap Smear 1-5 years ago  Allergies  Codeine Sulfate *ANALGESICS - OPIOID* Cephalexin *CEPHALOSPORINS* Beconase *NASAL AGENTS - SYSTEMIC AND TOPICAL* Atenolol *BETA BLOCKERS*  Medication History  AmLODIPine Besylate (5MG  Tablet, Oral) Active. Cyclobenzaprine HCl (10MG  Tablet, Oral) Active. Gabapentin (600MG  Tablet, Oral) Active. Ibuprofen (800MG  Tablet, Oral) Active. Lisinopril (40MG  Tablet, Oral) Active. Metoprolol Succinate ER (50MG  Tablet ER 24HR, Oral) Active. Garlic (1000MG  Capsule, Oral) Active. Aspirin (81MG  Tablet, Oral) Active. Fish Oil (1200MG  Capsule, Oral) Active. ProAir HFA (108 (90 Base)MCG/ACT Aerosol Soln, Inhalation) Active. Medications Reconciled  Social History  Caffeine use Carbonated  beverages, Tea. No alcohol use No drug use Tobacco use Never smoker.  Family History  Arthritis Mother. Bleeding disorder Mother. Cerebrovascular Accident Father. Diabetes Mellitus Father. Heart Disease Father, Mother. Heart disease in female family member before age 74 Heart disease in female family member before age 80 Hypertension Father, Mother. Malignant Neoplasm Of Pancreas Family Members In General. Respiratory Condition Mother. Thyroid problems Brother, Mother.  Pregnancy / Birth History  Age at menarche 12 years. Age of menopause 50-55 Contraceptive History Oral contraceptives. Gravida 4 Length (months) of breastfeeding 12-24 Maternal age 64-30 Para 4    Review of Systems  General Present- Fatigue. Not Present- Appetite Loss, Chills, Fever, Night Sweats, Weight Gain and Weight Loss. Skin Present- Rash. Not Present- Change in Wart/Mole, Dryness, Hives, Jaundice, New Lesions, Non-Healing Wounds and Ulcer. HEENT Present- Wears glasses/contact lenses. Not Present- Earache, Hearing Loss, Hoarseness, Nose Bleed, Oral Ulcers, Ringing in the Ears, Seasonal Allergies, Sinus Pain, Sore Throat, Visual Disturbances and Yellow Eyes. Respiratory Present- Snoring. Not Present- Bloody sputum, Chronic Cough, Difficulty Breathing and Wheezing. Breast Not Present- Breast Mass, Breast Pain, Nipple Discharge and Skin Changes. Cardiovascular Present- Chest Pain, Leg Cramps, Palpitations and Shortness of Breath. Not Present- Difficulty Breathing Lying Down, Rapid Heart Rate and Swelling of Extremities. Gastrointestinal Present- Nausea. Not Present- Abdominal Pain, Bloating, Bloody Stool, Change in Bowel Habits, Chronic diarrhea, Constipation, Difficulty Swallowing, Excessive gas, Gets full quickly at meals, Hemorrhoids, Indigestion, Rectal Pain and Vomiting. Female Genitourinary Present- Nocturia and Urgency. Not Present- Frequency, Painful Urination and Pelvic  Pain. Musculoskeletal Present- Muscle Pain and Muscle Weakness. Not Present- Back Pain, Joint Pain, Joint Stiffness and Swelling of Extremities. Neurological Present- Numbness, Tingling and Weakness. Not Present- Decreased Memory, Fainting, Headaches, Seizures, Tremor and Trouble walking. Psychiatric Not Present- Anxiety, Bipolar, Change in Sleep Pattern, Depression,  Fearful and Frequent crying. Endocrine Present- Hot flashes. Not Present- Cold Intolerance, Excessive Hunger, Hair Changes, Heat Intolerance and New Diabetes. Hematology Present- Blood Thinners. Not Present- Easy Bruising, Excessive bleeding, Gland problems, HIV and Persistent Infections.  Vitals  Weight: 221 lb Height: 64in Body Surface Area: 2.04 m Body Mass Index: 37.93 kg/m  Temp.: 8F(Oral)  Pulse: 73 (Regular)  BP: 142/84 (Sitting, Left Arm, Standard)       Physical Exam  General Note: Moderately obese WF (BMI 39) NAD HEENT no jaundice Neck supple Chest clear Heart SR without murmurs Abdomen nontender and obese Ext FROM with no history of DVT     Assessment & Plan  GALLSTONES (K80.20) Impression: GB booklet given to her. [facility] Current Plans Pt Education - Gallstones: discussed with patient and provided information.

## 2016-09-22 NOTE — Progress Notes (Signed)
BMp done 09/21/16 faxed via EPIC to Dr Daphine DeutscherMartin.

## 2016-09-23 ENCOUNTER — Ambulatory Visit (HOSPITAL_COMMUNITY): Payer: Managed Care, Other (non HMO)

## 2016-09-23 ENCOUNTER — Ambulatory Visit (HOSPITAL_COMMUNITY): Payer: Managed Care, Other (non HMO) | Admitting: Registered Nurse

## 2016-09-23 ENCOUNTER — Encounter (HOSPITAL_COMMUNITY)
Admission: RE | Disposition: A | Discharge status: Home / Self Care | Payer: Self-pay | Source: Ambulatory Visit | Attending: Surgery

## 2016-09-23 ENCOUNTER — Ambulatory Visit (HOSPITAL_COMMUNITY)
Admission: RE | Admit: 2016-09-23 | Discharge: 2016-09-23 | Disposition: A | Discharge status: Home / Self Care | Payer: Managed Care, Other (non HMO) | Source: Ambulatory Visit | Attending: Surgery | Admitting: Surgery

## 2016-09-23 ENCOUNTER — Encounter (HOSPITAL_COMMUNITY): Payer: Self-pay | Admitting: *Deleted

## 2016-09-23 DIAGNOSIS — J45909 Unspecified asthma, uncomplicated: Secondary | ICD-10-CM | POA: Insufficient documentation

## 2016-09-23 DIAGNOSIS — K8010 Calculus of gallbladder with chronic cholecystitis without obstruction: Secondary | ICD-10-CM | POA: Diagnosis not present

## 2016-09-23 DIAGNOSIS — K219 Gastro-esophageal reflux disease without esophagitis: Secondary | ICD-10-CM | POA: Insufficient documentation

## 2016-09-23 DIAGNOSIS — G43909 Migraine, unspecified, not intractable, without status migrainosus: Secondary | ICD-10-CM | POA: Insufficient documentation

## 2016-09-23 DIAGNOSIS — E7800 Pure hypercholesterolemia, unspecified: Secondary | ICD-10-CM | POA: Insufficient documentation

## 2016-09-23 DIAGNOSIS — Z79899 Other long term (current) drug therapy: Secondary | ICD-10-CM | POA: Insufficient documentation

## 2016-09-23 DIAGNOSIS — K8020 Calculus of gallbladder without cholecystitis without obstruction: Secondary | ICD-10-CM

## 2016-09-23 DIAGNOSIS — I10 Essential (primary) hypertension: Secondary | ICD-10-CM | POA: Insufficient documentation

## 2016-09-23 DIAGNOSIS — Z7982 Long term (current) use of aspirin: Secondary | ICD-10-CM | POA: Insufficient documentation

## 2016-09-23 DIAGNOSIS — G4730 Sleep apnea, unspecified: Secondary | ICD-10-CM | POA: Insufficient documentation

## 2016-09-23 HISTORY — PX: CHOLECYSTECTOMY: SHX55

## 2016-09-23 SURGERY — LAPAROSCOPIC CHOLECYSTECTOMY WITH INTRAOPERATIVE CHOLANGIOGRAM
Anesthesia: General | Site: Abdomen

## 2016-09-23 MED ORDER — DEXAMETHASONE SODIUM PHOSPHATE 10 MG/ML IJ SOLN
INTRAMUSCULAR | Status: AC
  Filled 2016-09-23: qty 1

## 2016-09-23 MED ORDER — SCOPOLAMINE 1 MG/3DAYS TD PT72
MEDICATED_PATCH | TRANSDERMAL | Status: AC
  Filled 2016-09-23: qty 1

## 2016-09-23 MED ORDER — ONDANSETRON HCL 4 MG/2ML IJ SOLN
INTRAMUSCULAR | Status: DC | PRN
  Administered 2016-09-23: 4 mg via INTRAVENOUS

## 2016-09-23 MED ORDER — PROPOFOL 10 MG/ML IV BOLUS
INTRAVENOUS | Status: DC | PRN
  Administered 2016-09-23: 200 mg via INTRAVENOUS

## 2016-09-23 MED ORDER — FENTANYL CITRATE (PF) 100 MCG/2ML IJ SOLN
INTRAMUSCULAR | Status: AC
  Filled 2016-09-23: qty 4

## 2016-09-23 MED ORDER — ACETAMINOPHEN 325 MG PO TABS: 650.0000 mg | ORAL_TABLET | ORAL | Status: DC | PRN

## 2016-09-23 MED ORDER — OXYCODONE HCL 5 MG PO TABS: 5.0000 mg | ORAL_TABLET | ORAL | Status: DC | PRN

## 2016-09-23 MED ORDER — ACETAMINOPHEN 650 MG RE SUPP: 650.0000 mg | RECTAL | Status: DC | PRN

## 2016-09-23 MED ORDER — 0.9 % SODIUM CHLORIDE (POUR BTL) OPTIME
TOPICAL | Status: DC | PRN
  Administered 2016-09-23: 1000 mL

## 2016-09-23 MED ORDER — SODIUM CHLORIDE 0.9% FLUSH: 3.0000 mL | INTRAVENOUS | Status: DC | PRN

## 2016-09-23 MED ORDER — FENTANYL CITRATE (PF) 100 MCG/2ML IJ SOLN
INTRAMUSCULAR | Status: DC | PRN
  Administered 2016-09-23 (×3): 50 ug via INTRAVENOUS

## 2016-09-23 MED ORDER — LIDOCAINE HCL (CARDIAC) 20 MG/ML IV SOLN
INTRAVENOUS | Status: DC | PRN
  Administered 2016-09-23: 100 mg via INTRAVENOUS

## 2016-09-23 MED ORDER — MIDAZOLAM HCL 5 MG/5ML IJ SOLN
INTRAMUSCULAR | Status: DC | PRN
  Administered 2016-09-23: 2 mg via INTRAVENOUS

## 2016-09-23 MED ORDER — SUGAMMADEX SODIUM 200 MG/2ML IV SOLN
INTRAVENOUS | Status: AC
  Filled 2016-09-23: qty 2

## 2016-09-23 MED ORDER — ACETAMINOPHEN 500 MG PO TABS
1000.0000 mg | ORAL_TABLET | ORAL | Status: AC
  Administered 2016-09-23: 1000 mg via ORAL
  Filled 2016-09-23: qty 2

## 2016-09-23 MED ORDER — LACTATED RINGERS IR SOLN
Status: DC | PRN
  Administered 2016-09-23: 1000 mL

## 2016-09-23 MED ORDER — SODIUM CHLORIDE 0.9 % IJ SOLN
INTRAMUSCULAR | Status: AC
  Filled 2016-09-23: qty 50

## 2016-09-23 MED ORDER — SODIUM CHLORIDE 0.9% FLUSH: 3.0000 mL | Freq: Two times a day (BID) | INTRAVENOUS | Status: DC

## 2016-09-23 MED ORDER — LIDOCAINE 2% (20 MG/ML) 5 ML SYRINGE
INTRAMUSCULAR | Status: AC
  Filled 2016-09-23: qty 5

## 2016-09-23 MED ORDER — SODIUM CHLORIDE 0.9 % IV SOLN: 250.0000 mL | INTRAVENOUS | Status: DC | PRN

## 2016-09-23 MED ORDER — CEFOTETAN DISODIUM-DEXTROSE 2-2.08 GM-% IV SOLR
INTRAVENOUS | Status: AC
  Filled 2016-09-23: qty 50

## 2016-09-23 MED ORDER — HYDROMORPHONE HCL 1 MG/ML IJ SOLN
0.2500 mg | INTRAMUSCULAR | Status: DC | PRN
  Administered 2016-09-23 (×2): 0.5 mg via INTRAVENOUS

## 2016-09-23 MED ORDER — GLYCOPYRROLATE 0.2 MG/ML IV SOSY
PREFILLED_SYRINGE | INTRAVENOUS | Status: DC | PRN
  Administered 2016-09-23: .2 mg via INTRAVENOUS

## 2016-09-23 MED ORDER — CELECOXIB 200 MG PO CAPS
400.0000 mg | ORAL_CAPSULE | ORAL | Status: AC
  Administered 2016-09-23: 400 mg via ORAL
  Filled 2016-09-23: qty 2

## 2016-09-23 MED ORDER — HYDROMORPHONE HCL 1 MG/ML IJ SOLN
INTRAMUSCULAR | Status: AC
  Filled 2016-09-23: qty 1

## 2016-09-23 MED ORDER — MIDAZOLAM HCL 2 MG/2ML IJ SOLN
INTRAMUSCULAR | Status: AC
  Filled 2016-09-23: qty 2

## 2016-09-23 MED ORDER — ROCURONIUM BROMIDE 10 MG/ML (PF) SYRINGE
PREFILLED_SYRINGE | INTRAVENOUS | Status: AC
  Filled 2016-09-23: qty 10

## 2016-09-23 MED ORDER — HEPARIN SODIUM (PORCINE) 5000 UNIT/ML IJ SOLN
5000.0000 [IU] | Freq: Once | INTRAMUSCULAR | Status: AC
  Administered 2016-09-23: 5000 [IU] via SUBCUTANEOUS
  Filled 2016-09-23: qty 1

## 2016-09-23 MED ORDER — ONDANSETRON HCL 4 MG/2ML IJ SOLN
INTRAMUSCULAR | Status: AC
  Filled 2016-09-23: qty 2

## 2016-09-23 MED ORDER — GABAPENTIN 300 MG PO CAPS
300.0000 mg | ORAL_CAPSULE | ORAL | Status: AC
  Administered 2016-09-23: 300 mg via ORAL
  Filled 2016-09-23: qty 1

## 2016-09-23 MED ORDER — SCOPOLAMINE 1 MG/3DAYS TD PT72
MEDICATED_PATCH | TRANSDERMAL | Status: DC | PRN
  Administered 2016-09-23: 1 via TRANSDERMAL

## 2016-09-23 MED ORDER — IOPAMIDOL (ISOVUE-300) INJECTION 61%
INTRAVENOUS | Status: AC
  Filled 2016-09-23: qty 50

## 2016-09-23 MED ORDER — SUCCINYLCHOLINE CHLORIDE 200 MG/10ML IV SOSY
PREFILLED_SYRINGE | INTRAVENOUS | Status: DC | PRN
  Administered 2016-09-23: 100 mg via INTRAVENOUS

## 2016-09-23 MED ORDER — ROCURONIUM BROMIDE 10 MG/ML (PF) SYRINGE
PREFILLED_SYRINGE | INTRAVENOUS | Status: DC | PRN
  Administered 2016-09-23: 30 mg via INTRAVENOUS
  Administered 2016-09-23: 10 mg via INTRAVENOUS

## 2016-09-23 MED ORDER — PROPOFOL 10 MG/ML IV BOLUS
INTRAVENOUS | Status: AC
  Filled 2016-09-23: qty 20

## 2016-09-23 MED ORDER — BUPIVACAINE-EPINEPHRINE 0.5% -1:200000 IJ SOLN
INTRAMUSCULAR | Status: DC | PRN
  Administered 2016-09-23: 20 mL

## 2016-09-23 MED ORDER — SUGAMMADEX SODIUM 200 MG/2ML IV SOLN
INTRAVENOUS | Status: DC | PRN
  Administered 2016-09-23: 200 mg via INTRAVENOUS

## 2016-09-23 MED ORDER — GLYCOPYRROLATE 0.2 MG/ML IV SOSY
PREFILLED_SYRINGE | INTRAVENOUS | Status: AC
  Filled 2016-09-23: qty 3

## 2016-09-23 MED ORDER — OXYCODONE-ACETAMINOPHEN 5-325 MG PO TABS: 1.0000 | ORAL_TABLET | ORAL | 0 refills | Status: DC | PRN

## 2016-09-23 MED ORDER — DEXAMETHASONE SODIUM PHOSPHATE 10 MG/ML IJ SOLN
INTRAMUSCULAR | Status: DC | PRN
Start: 2016-09-23 — End: 2016-09-23
  Administered 2016-09-23: 10 mg via INTRAVENOUS

## 2016-09-23 MED ORDER — IOPAMIDOL (ISOVUE-300) INJECTION 61%
INTRAVENOUS | Status: DC | PRN
  Administered 2016-09-23: 6 mL

## 2016-09-23 MED ORDER — BUPIVACAINE-EPINEPHRINE 0.5% -1:200000 IJ SOLN
INTRAMUSCULAR | Status: AC
  Filled 2016-09-23: qty 1

## 2016-09-23 MED ORDER — LACTATED RINGERS IV SOLN
INTRAVENOUS | Status: DC
  Administered 2016-09-23: 12:00:00 via INTRAVENOUS
  Administered 2016-09-23: 1000 mL via INTRAVENOUS

## 2016-09-23 MED ORDER — CEFOTETAN DISODIUM-DEXTROSE 2-2.08 GM-% IV SOLR
2.0000 g | INTRAVENOUS | Status: AC
  Administered 2016-09-23: 2 g via INTRAVENOUS

## 2016-09-23 MED ORDER — PROMETHAZINE HCL 25 MG/ML IJ SOLN: 6.2500 mg | INTRAMUSCULAR | Status: DC | PRN

## 2016-09-23 SURGICAL SUPPLY — 43 items
ADH SKN CLS APL DERMABOND .7 (GAUZE/BANDAGES/DRESSINGS) ×1
APL SKNCLS STERI-STRIP NONHPOA (GAUZE/BANDAGES/DRESSINGS)
APPLICATOR COTTON TIP 6IN STRL (MISCELLANEOUS) ×6 IMPLANT
APPLIER CLIP ROT 10 11.4 M/L (STAPLE) ×3
APR CLP MED LRG 11.4X10 (STAPLE) ×1
BAG SPEC RTRVL 10 TROC 200 (ENDOMECHANICALS) ×1
BENZOIN TINCTURE PRP APPL 2/3 (GAUZE/BANDAGES/DRESSINGS) IMPLANT
CABLE HIGH FREQUENCY MONO STRZ (ELECTRODE) ×3 IMPLANT
CATH REDDICK CHOLANGI 4FR 50CM (CATHETERS) ×3 IMPLANT
CLIP APPLIE ROT 10 11.4 M/L (STAPLE) ×1 IMPLANT
CLOSURE WOUND 1/2 X4 (GAUZE/BANDAGES/DRESSINGS)
COVER MAYO STAND STRL (DRAPES) ×3 IMPLANT
COVER SURGICAL LIGHT HANDLE (MISCELLANEOUS) ×3 IMPLANT
DECANTER SPIKE VIAL GLASS SM (MISCELLANEOUS) ×1 IMPLANT
DERMABOND ADVANCED (GAUZE/BANDAGES/DRESSINGS) ×2
DERMABOND ADVANCED .7 DNX12 (GAUZE/BANDAGES/DRESSINGS) ×1 IMPLANT
DRAPE C-ARM 42X120 X-RAY (DRAPES) ×3 IMPLANT
ELECT PENCIL ROCKER SW 15FT (MISCELLANEOUS) ×2 IMPLANT
ELECT REM PT RETURN 9FT ADLT (ELECTROSURGICAL) ×3
ELECTRODE REM PT RTRN 9FT ADLT (ELECTROSURGICAL) ×1 IMPLANT
GLOVE BIOGEL M 8.0 STRL (GLOVE) ×3 IMPLANT
GOWN STRL REUS W/TWL XL LVL3 (GOWN DISPOSABLE) ×9 IMPLANT
HEMOSTAT SURGICEL 4X8 (HEMOSTASIS) IMPLANT
IRRIG SUCT STRYKERFLOW 2 WTIP (MISCELLANEOUS) ×3
IRRIGATION SUCT STRKRFLW 2 WTP (MISCELLANEOUS) ×1 IMPLANT
IV CATH 14GX2 1/4 (CATHETERS) ×3 IMPLANT
KIT BASIN OR (CUSTOM PROCEDURE TRAY) ×3 IMPLANT
L-HOOK LAP DISP 36CM (ELECTROSURGICAL) ×3
LHOOK LAP DISP 36CM (ELECTROSURGICAL) IMPLANT
LIQUID BAND (GAUZE/BANDAGES/DRESSINGS) ×2 IMPLANT
POUCH RETRIEVAL ECOSAC 10 (ENDOMECHANICALS) IMPLANT
POUCH RETRIEVAL ECOSAC 10MM (ENDOMECHANICALS) ×2
SCISSORS LAP 5X45 EPIX DISP (ENDOMECHANICALS) ×3 IMPLANT
SLEEVE XCEL OPT CAN 5 100 (ENDOMECHANICALS) ×3 IMPLANT
STRIP CLOSURE SKIN 1/2X4 (GAUZE/BANDAGES/DRESSINGS) IMPLANT
SUT VIC AB 4-0 SH 18 (SUTURE) ×3 IMPLANT
SYR 20CC LL (SYRINGE) ×3 IMPLANT
TOWEL OR 17X26 10 PK STRL BLUE (TOWEL DISPOSABLE) ×3 IMPLANT
TRAY LAPAROSCOPIC (CUSTOM PROCEDURE TRAY) ×3 IMPLANT
TROCAR BLADELESS OPT 5 100 (ENDOMECHANICALS) ×3 IMPLANT
TROCAR XCEL BLUNT TIP 100MML (ENDOMECHANICALS) IMPLANT
TROCAR XCEL NON-BLD 11X100MML (ENDOMECHANICALS) ×3 IMPLANT
TUBING INSUF HEATED (TUBING) ×3 IMPLANT

## 2016-09-23 NOTE — Anesthesia Procedure Notes (Signed)
Procedure Name: Intubation Date/Time: 09/23/2016 12:29 PM Performed by: Jarvis NewcomerARMISTEAD, Sloan Takagi A Pre-anesthesia Checklist: Patient identified, Timeout performed, Emergency Drugs available, Suction available and Patient being monitored Patient Re-evaluated:Patient Re-evaluated prior to inductionOxygen Delivery Method: Circle system utilized Preoxygenation: Pre-oxygenation with 100% oxygen Intubation Type: IV induction Ventilation: Mask ventilation without difficulty Laryngoscope Size: Mac and 4 Grade View: Grade I Tube type: Oral Tube size: 7.5 mm Number of attempts: 1 Airway Equipment and Method: Stylet Placement Confirmation: ETT inserted through vocal cords under direct vision,  positive ETCO2 and breath sounds checked- equal and bilateral Secured at: 21 cm Tube secured with: Tape Dental Injury: Teeth and Oropharynx as per pre-operative assessment

## 2016-09-23 NOTE — Anesthesia Postprocedure Evaluation (Signed)
Anesthesia Post Note  Patient: Trixie Deisatricia E Rissler  Procedure(s) Performed: Procedure(s) (LRB): LAPAROSCOPIC CHOLECYSTECTOMY WITH INTRAOPERATIVE CHOLANGIOGRAM (N/A)  Patient location during evaluation: PACU Anesthesia Type: General Level of consciousness: awake and alert Pain management: pain level controlled Vital Signs Assessment: post-procedure vital signs reviewed and stable Respiratory status: spontaneous breathing, nonlabored ventilation and respiratory function stable Cardiovascular status: blood pressure returned to baseline and stable Postop Assessment: no signs of nausea or vomiting Anesthetic complications: no    Last Vitals:  Vitals:   09/23/16 1500 09/23/16 1515  BP: (!) 141/79 (!) 146/82  Pulse: 82 81  Resp: (!) 22 18  Temp: 36.6 C 36.7 C    Last Pain:  Vitals:   09/23/16 1515  TempSrc:   PainSc: 2                  Loma Dubuque A

## 2016-09-23 NOTE — Discharge Instructions (Signed)
General Anesthesia, Adult, Care After Refer to this sheet in the next few weeks. These instructions provide you with information on caring for yourself after your procedure. Your health care provider may also give you more specific instructions. Your treatment has been planned according to current medical practices, but problems sometimes occur. Call your health care provider if you have any problems or questions after your procedure. WHAT TO EXPECT AFTER THE PROCEDURE After the procedure, it is typical to experience: Sleepiness. Nausea and vomiting. HOME CARE INSTRUCTIONS For the first 24 hours after general anesthesia: Have a responsible person with you. Do not drive a car. If you are alone, do not take public transportation. Do not drink alcohol. Do not take medicine that has not been prescribed by your health care provider. Do not sign important papers or make important decisions. You may resume a normal diet and activities as directed by your health care provider. Change bandages (dressings) as directed. If you have questions or problems that seem related to general anesthesia, call the hospital and ask for the anesthetist or anesthesiologist on call. SEEK MEDICAL CARE IF: You have nausea and vomiting that continue the day after anesthesia. You develop a rash. SEEK IMMEDIATE MEDICAL CARE IF:  You have difficulty breathing. You have chest pain. You have any allergic problems.   This information is not intended to replace advice given to you by your health care provider. Make sure you discuss any questions you have with your health care provider.   Document Released: 03/20/2001 Document Revised: 01/02/2015 Document Reviewed: 04/11/2012 Elsevier Interactive Patient Education 2016 Elsevier Inc. Laparoscopic Cholecystectomy Laparoscopic cholecystectomy is surgery to remove the gallbladder. The gallbladder is located in the upper right part of the abdomen, behind the liver. It is a  storage sac for bile, which is produced in the liver. Bile aids in the digestion and absorption of fats. Cholecystectomy is often done for inflammation of the gallbladder (cholecystitis). This condition is usually caused by a buildup of gallstones (cholelithiasis) in the gallbladder. Gallstones can block the flow of bile, and that can result in inflammation and pain. In severe cases, emergency surgery may be required. If emergency surgery is not required, you will have time to prepare for the procedure. Laparoscopic surgery is an alternative to open surgery. Laparoscopic surgery has a shorter recovery time. Your common bile duct may also need to be examined during the procedure. If stones are found in the common bile duct, they may be removed. LET [facility] CARE PROVIDER KNOW ABOUT:  Any allergies you have.  All medicines you are taking, including vitamins, herbs, eye drops, creams, and over-the-counter medicines.  Previous problems you or members of your family have had with the use of anesthetics.  Any blood disorders you have.  Previous surgeries you have had.  Any medical conditions you have. RISKS AND COMPLICATIONS Generally, this is a safe procedure. However, problems may occur, including:  Infection.  Bleeding.  Allergic reactions to medicines.  Damage to other structures or organs.  A stone remaining in the common bile duct.  A bile leak from the cyst duct that is clipped when your gallbladder is removed.  The need to convert to open surgery, which requires a larger incision in the abdomen. This may be necessary if your surgeon thinks that it is not safe to continue with a laparoscopic procedure. BEFORE THE PROCEDURE  Ask your health care provider about:  Changing or stopping your regular medicines. This is especially important if you are  taking diabetes medicines or blood thinners.  Taking medicines such as aspirin and ibuprofen. These medicines can thin your blood. Do  not take these medicines before your procedure if your health care provider instructs you not to.  Follow instructions from your health care provider about eating or drinking restrictions.  Let your health care provider know if you develop a cold or an infection before surgery.  Plan to have someone take you home after the procedure.  Ask your health care provider how your surgical site will be marked or identified.  You may be given antibiotic medicine to help prevent infection. PROCEDURE  To reduce your risk of infection:  Your health care team will wash or sanitize their hands.  Your skin will be washed with soap.  An IV tube may be inserted into one of your veins.  You will be given a medicine to make you fall asleep (general anesthetic).  A breathing tube will be placed in your mouth.  The surgeon will make several small cuts (incisions) in your abdomen.  A thin, lighted tube (laparoscope) that has a tiny camera on the end will be inserted through one of the small incisions. The camera on the laparoscope will send a picture to a TV screen (monitor) in the operating room. This will give the surgeon a good view inside your abdomen.  A gas will be pumped into your abdomen. This will expand your abdomen to give the surgeon more room to perform the surgery.  Other tools that are needed for the procedure will be inserted through the other incisions. The gallbladder will be removed through one of the incisions.  After your gallbladder has been removed, the incisions will be closed with stitches (sutures), staples, or skin glue.  Your incisions may be covered with a bandage (dressing). The procedure may vary among health care providers and hospitals. AFTER THE PROCEDURE  Your blood pressure, heart rate, breathing rate, and blood oxygen level will be monitored often until the medicines you were given have worn off.  You will be given medicines as needed to control your pain.     This information is not intended to replace advice given to you by your health care provider. Make sure you discuss any questions you have with your health care provider.   Document Released: 12/12/2005 Document Revised: 09/02/2015 Document Reviewed: 07/24/2013 Elsevier Interactive Patient Education [employer]2016 Elsevier Inc.

## 2016-09-23 NOTE — Interval H&P Note (Signed)
History and Physical Interval Note:  09/23/2016 11:39 AM  Trixie DeisPatricia E [name]  has presented today for surgery, with the diagnosis of gallstones  The various methods of treatment have been discussed with the patient and family. After consideration of risks, benefits and other options for treatment, the patient has consented to  Procedure(s): LAPAROSCOPIC CHOLECYSTECTOMY WITH INTRAOPERATIVE CHOLANGIOGRAM (N/A) as a surgical intervention .  The patient's history has been reviewed, patient examined, no change in status, stable for surgery.  I have reviewed the patient's chart and labs.  Questions were answered to the patient's satisfaction.     Julea Hutto B

## 2016-09-23 NOTE — Op Note (Signed)
Norma [name]   09/23/2016  2:14 PM  Procedure: Laparoscopic Cholecystectomy with intraoperative cholangiogram  Surgeon: Susy FrizzleMatt B. Daphine DeutscherMartin, MD, FACS Asst:  Avel Peaceodd Rosenbower, MD. FACS  Anes:  General  Drains:  None  Findings: Chronic cholecystitis with multiple black anthracotic pigment stones;  Normal IOC  Description of Procedure: The patient was taken to OR 1 and given general anesthesia.  The patient was prepped with PCMX and draped sterilely. A time out was performed.  Access to the abdomen was achieved with a 5 mm Optiview through the right upper quadrant.  Port placement included three 5 mm trocars and one 11 mm trocar in the upper midline.    The gallbladder was visualized and the fundus was grasped and the gallbladder was elevated. Traction on the infundibulum allowed for successful demonstration of the critical view. Inflammatory changes were chronic.  The cystic duct was identified and clipped up on the gallbladder and an incision was made in the cystic duct and the Reddick catheter was inserted after milking the cystic duct of any debris. A dynamic cholangiogram was performed which demonstrated which filled the common bile duct and had free flow into the duodenum and intrahepatic filling.    The cystic duct was then triple clipped and divided, the cystic artery was felt to be in the cystic duct stalk and then the gallbladder was removed from the gallbladder bed. Removal of the gallbladder from the gallbladder bed was done with the hook electrocautery.  The gallbladder was then placed in a bag and brought out through one of the trocar sites. The gallbladder bed was inspected and no bleeding or bile leaks were seen.   Incisions were injected with marcaine and closed with 4-0 Vicryl and Liquiban on the skin.  Sponge and needle count were correct.    The patient was taken to the recovery room in satisfactory condition.

## 2016-09-23 NOTE — Anesthesia Preprocedure Evaluation (Addendum)
Anesthesia Evaluation  Patient identified by MRN, date of birth, ID band Patient awake    Reviewed: Allergy & Precautions, H&P , NPO status , Patient's Chart, lab work & pertinent test results  History of Anesthesia Complications (+) PONV and history of anesthetic complications  Airway Mallampati: II  TM Distance: >3 FB Neck ROM: full    Dental no notable dental hx.    Pulmonary asthma , sleep apnea ,    Pulmonary exam normal breath sounds clear to auscultation       Cardiovascular hypertension, Normal cardiovascular exam(-) dysrhythmias  Rhythm:regular Rate:Normal     Neuro/Psych  Headaches,    GI/Hepatic Neg liver ROS, GERD  ,  Endo/Other  negative endocrine ROS  Renal/GU negative Renal ROS     Musculoskeletal   Abdominal   Peds  Hematology negative hematology ROS (+)   Anesthesia Other Findings   Reproductive/Obstetrics negative OB ROS                             Anesthesia Physical Anesthesia Plan  ASA: II  Anesthesia Plan: General   Post-op Pain Management:    Induction: Intravenous  Airway Management Planned: Oral ETT  Additional Equipment: None  Intra-op Plan:   Post-operative Plan: Extubation in OR  Informed Consent: I have reviewed the patients History and Physical, chart, labs and discussed the procedure including the risks, benefits and alternatives for the proposed anesthesia with the patient or authorized representative who has indicated his/her understanding and acceptance.   Dental Advisory Given  Plan Discussed with: Anesthesiologist, CRNA and Surgeon  Anesthesia Plan Comments:         Anesthesia Quick Evaluation

## 2016-09-23 NOTE — Transfer of Care (Signed)
Immediate Anesthesia Transfer of Care Note  Patient: Norma [name]  Procedure(s) Performed: Procedure(s): LAPAROSCOPIC CHOLECYSTECTOMY WITH INTRAOPERATIVE CHOLANGIOGRAM (N/A)  Patient Location: PACU  Anesthesia Type:General  Level of Consciousness: awake, alert , oriented and patient cooperative  Airway & Oxygen Therapy: Patient Spontanous Breathing and Patient connected to face mask oxygen  Post-op Assessment: Report given to RN, Post -op Vital signs reviewed and stable and Patient moving all extremities  Post vital signs: Reviewed and stable  Last Vitals:  Vitals:   09/23/16 1011  BP: 137/78  Pulse: 75  Resp: 16  Temp: 36.9 C    Last Pain:  Vitals:   09/23/16 1011  TempSrc: Oral      Patients Stated Pain Goal: 4 (09/23/16 1048)  Complications: No apparent anesthesia complications

## 2017-02-28 ENCOUNTER — Other Ambulatory Visit: Payer: Self-pay | Admitting: Family Medicine

## 2017-02-28 DIAGNOSIS — Z1231 Encounter for screening mammogram for malignant neoplasm of breast: Secondary | ICD-10-CM

## 2017-03-22 ENCOUNTER — Ambulatory Visit
Admission: RE | Admit: 2017-03-22 | Discharge: 2017-03-22 | Disposition: A | Discharge status: Home / Self Care | Payer: Managed Care, Other (non HMO) | Source: Ambulatory Visit | Attending: Family Medicine | Admitting: Family Medicine

## 2017-03-22 DIAGNOSIS — Z1231 Encounter for screening mammogram for malignant neoplasm of breast: Secondary | ICD-10-CM

## 2017-03-23 ENCOUNTER — Other Ambulatory Visit: Payer: Self-pay | Admitting: Family Medicine

## 2017-03-23 DIAGNOSIS — R928 Other abnormal and inconclusive findings on diagnostic imaging of breast: Secondary | ICD-10-CM

## 2017-03-29 ENCOUNTER — Ambulatory Visit
Admission: RE | Admit: 2017-03-29 | Discharge: 2017-03-29 | Disposition: A | Discharge status: Home / Self Care | Payer: Managed Care, Other (non HMO) | Source: Ambulatory Visit | Attending: Family Medicine | Admitting: Family Medicine

## 2017-03-29 DIAGNOSIS — R928 Other abnormal and inconclusive findings on diagnostic imaging of breast: Secondary | ICD-10-CM

## 2017-10-26 ENCOUNTER — Other Ambulatory Visit: Payer: Self-pay | Admitting: Family Medicine

## 2017-10-26 ENCOUNTER — Other Ambulatory Visit (HOSPITAL_COMMUNITY)
Admission: RE | Admit: 2017-10-26 | Discharge: 2017-10-26 | Disposition: A | Discharge status: Home / Self Care | Payer: Managed Care, Other (non HMO) | Source: Ambulatory Visit | Attending: Family Medicine | Admitting: Family Medicine

## 2017-10-26 DIAGNOSIS — Z01419 Encounter for gynecological examination (general) (routine) without abnormal findings: Secondary | ICD-10-CM | POA: Diagnosis present

## 2017-10-27 LAB — CYTOLOGY - PAP: Diagnosis: NEGATIVE

## 2018-04-30 ENCOUNTER — Other Ambulatory Visit: Payer: Self-pay | Admitting: Family Medicine

## 2018-04-30 DIAGNOSIS — Z1231 Encounter for screening mammogram for malignant neoplasm of breast: Secondary | ICD-10-CM

## 2018-05-17 ENCOUNTER — Other Ambulatory Visit: Payer: Self-pay | Admitting: Family Medicine

## 2018-05-17 ENCOUNTER — Ambulatory Visit
Admission: RE | Admit: 2018-05-17 | Discharge: 2018-05-17 | Disposition: A | Discharge status: Home / Self Care | Payer: Managed Care, Other (non HMO) | Source: Ambulatory Visit | Attending: Family Medicine | Admitting: Family Medicine

## 2018-05-17 DIAGNOSIS — M25552 Pain in left hip: Secondary | ICD-10-CM

## 2018-05-17 DIAGNOSIS — Z1231 Encounter for screening mammogram for malignant neoplasm of breast: Secondary | ICD-10-CM

## 2018-05-18 ENCOUNTER — Other Ambulatory Visit: Payer: Self-pay | Admitting: Family Medicine

## 2018-05-18 DIAGNOSIS — R928 Other abnormal and inconclusive findings on diagnostic imaging of breast: Secondary | ICD-10-CM

## 2018-05-24 ENCOUNTER — Other Ambulatory Visit: Payer: Self-pay | Admitting: Family Medicine

## 2018-05-24 ENCOUNTER — Ambulatory Visit
Admission: RE | Admit: 2018-05-24 | Discharge: 2018-05-24 | Disposition: A | Discharge status: Home / Self Care | Payer: Managed Care, Other (non HMO) | Source: Ambulatory Visit | Attending: Family Medicine | Admitting: Family Medicine

## 2018-05-24 DIAGNOSIS — R928 Other abnormal and inconclusive findings on diagnostic imaging of breast: Secondary | ICD-10-CM

## 2018-05-28 ENCOUNTER — Ambulatory Visit
Admission: RE | Admit: 2018-05-28 | Discharge: 2018-05-28 | Disposition: A | Discharge status: Home / Self Care | Payer: Managed Care, Other (non HMO) | Source: Ambulatory Visit | Attending: Family Medicine | Admitting: Family Medicine

## 2018-05-28 DIAGNOSIS — R928 Other abnormal and inconclusive findings on diagnostic imaging of breast: Secondary | ICD-10-CM

## 2018-06-07 ENCOUNTER — Ambulatory Visit: Payer: Self-pay | Admitting: Surgery

## 2018-06-07 DIAGNOSIS — N6320 Unspecified lump in the left breast, unspecified quadrant: Secondary | ICD-10-CM

## 2018-06-07 NOTE — H&P (Signed)
Chief Complaint:  Recent stereotactic left breast biopsy with residual suspicious area and clip placed  History of Present Illness:  Norma [name] is an 57 y.o. female had a screening mammogram was found to have a suspicious area in the left inner quadrant lower left breast.  Stereotactic biopsy was performed which revealed fibrocystic change including sclerosing adenosis and ductal hyperplasia and a small benign intraductal papilloma.  No cancer was noted.  The architectural distortion of the area caused some concern for completion lumpectomy.  The patient is aware of the findings and wants to proceed with a radioactive seed localization and excision.  She was seen in the office and informed consent was obtained.  Past Medical History:  Diagnosis Date  . Asthma   . Costochondritis   . GERD (gastroesophageal reflux disease)   . Headache   . HTN (hypertension)   . Hyperlipidemia   . Obesity   . Palpitations   . Paresthesias    on Neurontin  . PONV (postoperative nausea and vomiting)   . PVC (premature ventricular contraction)   . Sleep apnea    mild no cpap     Past Surgical History:  Procedure Laterality Date  . CESAREAN SECTION    . CHOLECYSTECTOMY N/A 09/23/2016   Procedure: LAPAROSCOPIC CHOLECYSTECTOMY WITH INTRAOPERATIVE CHOLANGIOGRAM;  Surgeon: Luretha MurphyMatthew Verita Kuroda, MD;  Location: [facility];  Service: General;  Laterality: N/A;  . EYE SURGERY     to correct cross sidedness   . novasure procedure     . TUBAL LIGATION      Current Outpatient Medications  Medication Sig Dispense Refill  . albuterol (PROVENTIL HFA;VENTOLIN HFA) 108 (90 Base) MCG/ACT inhaler Inhale 2 puffs into the lungs every 6 (six) hours as needed for wheezing or shortness of breath.    Marland Kitchen. amLODipine (NORVASC) 5 MG tablet Take 5 mg by mouth daily.    Marland Kitchen. aspirin 81 MG tablet Take 81 mg by mouth daily.      . cyclobenzaprine (FLEXERIL) 5 MG tablet Take 5 mg by mouth at bedtime as needed for muscle spasms.    Marland Kitchen.  gabapentin (NEURONTIN) 600 MG tablet Take 600 mg by mouth at bedtime.     . Garlic 1000 MG CAPS Take by mouth daily. 1-2 tablets daily    . ibuprofen (ADVIL,MOTRIN) 800 MG tablet Take 800 mg by mouth every 8 (eight) hours as needed for pain.    Marland Kitchen. lisinopril (PRINIVIL,ZESTRIL) 40 MG tablet Take 40 mg by mouth daily.    Marland Kitchen. lisinopril (PRINIVIL,ZESTRIL) 5 MG tablet Take 2 tablets (10 mg total) by mouth daily. (Patient not taking: Reported on 09/20/2016) 90 tablet 6  . metoprolol succinate (TOPROL-XL) 50 MG 24 hr tablet Take 50 mg by mouth every evening.     . nebivolol (BYSTOLIC) 10 MG tablet Take 1 tablet (10 mg total) by mouth 2 (two) times daily. (Patient not taking: Reported on 09/20/2016) 180 tablet 3  . Omega-3 Fatty Acids (FISH OIL PO) Take 2 capsules by mouth 2 (two) times daily. Each capsule 1290 mg    . oxyCODONE-acetaminophen (ROXICET) 5-325 MG tablet Take 1 tablet by mouth every 4 (four) hours as needed for severe pain. 20 tablet 0   No current facility-administered medications for this visit.    Beconase aq [beclomethasone diprop monohyd]; Atenolol; Other; Cephalexin; and Codeine Family History  Problem Relation Age of Onset  . Stroke Father    Social History:   reports that she has never smoked. She has never used  smokeless tobacco. She reports that she does not drink alcohol or use drugs.   REVIEW OF SYSTEMS : Negative except for see her problem list  Physical Exam:   There were no vitals taken for this visit. There is no height or weight on file to calculate BMI.  Gen:  WDWN white female NAD  Neurological: Alert and oriented to person, place, and time. Motor and sensory function is grossly intact  Head: Normocephalic and atraumatic.  Eyes: Conjunctivae are normal. Pupils are equal, round, and reactive to light. No scleral icterus.  Neck: Normal range of motion. Neck supple. No tracheal deviation or thyromegaly present.  Cardiovascular:  SR without murmurs or gallops.  No  carotid bruits Breast:  Left breast there is an entrance site at about the 9 o'clock position with some bruising in the lower inner quadrant of the left breast.  No palpable masses noted.  No axillary adenopathy noted on the left or the right.  The right breast is without any palpable masses.  The patient has no nipple discharge summary side. Respiratory: Effort normal.  No respiratory distress. No chest wall tenderness. Breath sounds normal.  No wheezes, rales or rhonchi.  Abdomen:  Prior laparoscopic cholecystectomy by me with usual incisions GU:  Not examined Musculoskeletal: Normal range of motion. Extremities are nontender. No cyanosis, edema or clubbing noted Lymphadenopathy: No cervical, preauricular, postauricular or axillary adenopathy is present Skin: Skin is warm and dry. No rash noted. No diaphoresis. No erythema. No pallor. Pscyh: Normal mood and affect. Behavior is normal. Judgment and thought content normal.   LABORATORY RESULTS: No results found for this or any previous visit (from the past 48 hour(s)).   RADIOLOGY RESULTS: No results found.  Problem List: Patient Active Problem List   Diagnosis Date Noted  . PVC (premature ventricular contraction) 12/07/2011  . URI (upper respiratory infection) 12/07/2011  . Mixed hyperlipidemia 12/07/2011  . Dizzy 12/07/2011    Assessment & Plan: Architectural distortion left breast with prior biopsy and clip placement.  For left radioactive seed localized breast biopsy.    Matt B. Daphine Deutscher, MD, [facility] [phone] beeper [phone]  06/07/2018 11:54 AM

## 2018-06-09 ENCOUNTER — Other Ambulatory Visit: Payer: Self-pay | Admitting: Surgery

## 2018-06-09 DIAGNOSIS — N6320 Unspecified lump in the left breast, unspecified quadrant: Secondary | ICD-10-CM

## 2018-07-11 ENCOUNTER — Encounter (HOSPITAL_BASED_OUTPATIENT_CLINIC_OR_DEPARTMENT_OTHER): Payer: Self-pay | Admitting: *Deleted

## 2018-07-11 ENCOUNTER — Other Ambulatory Visit: Payer: Self-pay

## 2018-07-11 NOTE — Progress Notes (Signed)
   07/11/18 1535  OBSTRUCTIVE SLEEP APNEA  Have you ever been diagnosed with sleep apnea through a sleep study? Yes (mild OSA, no CPAP recommended)  If yes, do you have and use a CPAP or BPAP machine every night? 0  Do you snore loudly (loud enough to be heard through closed doors)?  1  Do you often feel tired, fatigued, or sleepy during the daytime (such as falling asleep during driving or talking to someone)? 1  Has anyone observed you stop breathing during your sleep? 0  Do you have, or are you being treated for high blood pressure? 1  BMI more than 35 kg/m2? 1  Age > 50 (1-yes) 1  Female Gender (Yes=1) 0  Obstructive Sleep Apnea Score 5

## 2018-07-12 ENCOUNTER — Encounter (HOSPITAL_BASED_OUTPATIENT_CLINIC_OR_DEPARTMENT_OTHER)
Admission: RE | Admit: 2018-07-12 | Discharge: 2018-07-12 | Disposition: A | Discharge status: Home / Self Care | Payer: Managed Care, Other (non HMO) | Source: Ambulatory Visit | Attending: Surgery | Admitting: Surgery

## 2018-07-12 DIAGNOSIS — I493 Ventricular premature depolarization: Secondary | ICD-10-CM | POA: Diagnosis not present

## 2018-07-12 DIAGNOSIS — I10 Essential (primary) hypertension: Secondary | ICD-10-CM | POA: Diagnosis not present

## 2018-07-12 DIAGNOSIS — Z01818 Encounter for other preprocedural examination: Secondary | ICD-10-CM | POA: Insufficient documentation

## 2018-07-12 DIAGNOSIS — Z01812 Encounter for preprocedural laboratory examination: Secondary | ICD-10-CM | POA: Diagnosis not present

## 2018-07-12 LAB — BASIC METABOLIC PANEL
ANION GAP: 8 (ref 5–15)
BUN: 12 mg/dL (ref 6–20)
CALCIUM: 9.5 mg/dL (ref 8.9–10.3)
CHLORIDE: 104 mmol/L (ref 98–111)
CO2: 26 mmol/L (ref 22–32)
CREATININE: 0.68 mg/dL (ref 0.44–1.00)
GFR calc non Af Amer: >60 mL/min (ref >60)
Glucose, Bld: 126 mg/dL — ABNORMAL HIGH (ref 70–99)
Potassium: 4.3 mmol/L (ref 3.5–5.1)
SODIUM: 138 mmol/L (ref 135–145)

## 2018-07-12 NOTE — Progress Notes (Signed)
Ensure pre surgery drink given with instructions to complete by 0830 dos, pt verbalized understanding. 

## 2018-07-19 ENCOUNTER — Ambulatory Visit
Admission: RE | Admit: 2018-07-19 | Discharge: 2018-07-19 | Disposition: A | Discharge status: Home / Self Care | Payer: Managed Care, Other (non HMO) | Source: Ambulatory Visit | Attending: Surgery | Admitting: Surgery

## 2018-07-19 DIAGNOSIS — N6320 Unspecified lump in the left breast, unspecified quadrant: Secondary | ICD-10-CM

## 2018-07-19 NOTE — H&P (Signed)
Norma [name] Documented: 06/07/2018 11:12 AM Location: [place] Office Patient #: [id number] DOB: 09/12/1961 Married / Language: English / Race: White Female   History of Present Illness Norma [name](Norma Giambra B. Daphine DeutscherMartin MD; 06/07/2018 11:43 AM) The patient is a 57 year old female who presents with a complaint of Breast problems. Norma [name] has a negative family history of breast cancer but was found on recent mammogram to have an area of some suspicion. She underwent biopsies of the left breast in the lower inner quadrant which revealed a small benign intraductal papilloma and sclerosing diagnosis and ductal hyperplasia. They left a clip and were concerned that maybe there was some distortion here that might be missing something of significance. Patient would like to proceed with breast biopsy at this time.  On exam she has a core biopsy entrance site of the nine o'clock position in the left breast and some mild bruising. I feel no palpable masses in the breast and no axillary masses. The right breast exam is unremarkable for masses or suspicious areas.  I discussed a seed guided left breast biopsy with her and [facility].   Allergies Norma [name](Norma Dockery, LPN; 0/86/57846/13/2019 69:6211:13 AM) Codeine Sulfate *ANALGESICS - OPIOID*  Cephalexin *CEPHALOSPORINS*  Beconase *NASAL AGENTS - SYSTEMIC AND TOPICAL*  Atenolol *BETA BLOCKERS*   Medication History Norma [name](Norma Dockery, LPN; 9/52/84136/13/2019 24:4011:15 AM) Lisinopril (40MG  Tablet, 45 mg Oral) Active. AmLODIPine Besylate (5MG  Tablet, Oral) Active. Cyclobenzaprine HCl (10MG  Tablet, Oral) Active. Gabapentin (600MG  Tablet, Oral) Active. Ibuprofen (800MG  Tablet, Oral) Active. Metoprolol Succinate ER (50MG  Tablet ER 24HR, Oral) Active. Garlic (1000MG  Capsule, Oral) Active. Aspirin (81MG  Tablet, Oral) Active. Fish Oil (1200MG  Capsule, Oral) Active. ProAir HFA (108 (90 Base)MCG/ACT Aerosol Soln, Inhalation) Active. Medications  Reconciled  Vitals Norma [name](Norma Dockery LPN; 1/02/72536/13/2019 66:4411:13 AM) 06/07/2018 11:12 AM Weight: 226.8 lb Height: 64in Body Surface Area: 2.06 m Body Mass Index: 38.93 kg/m  Temp.: 97.65F(Oral)  Pulse: 58 (Regular)  BP: 122/82 (Sitting, Left Arm, Standard)  HEENT unremarkable Neck supple Chest clear Heart SR     Physical Exam  Breast Note: post core biopsy of the left breast      Assessment & Plan Norma [name](Norma Nardozzi B. Daphine DeutscherMartin MD; 06/07/2018 11:49 AM) MASS OF LEFT BREAST ON MAMMOGRAM (N63.20) Impression: Persistent asymmetry with questionable/subtle architectural distortion in the lower inner quadrant of the LEFT breast, without definite ultrasound correlate, for which stereotactic biopsy is recommended to ensure benignity. sclerosing adenosis and ductal hyperplasia and a small benign intraductal papilloma. He was negative for cancer. There were some concerns about the asymmetry and that this mass effect should be removed. We'll plan seed localized left breast biopsy a combination   Matt B. Daphine DeutscherMartin, MD, FACS

## 2018-07-20 ENCOUNTER — Encounter (HOSPITAL_BASED_OUTPATIENT_CLINIC_OR_DEPARTMENT_OTHER)
Admission: RE | Disposition: A | Discharge status: Home / Self Care | Payer: Self-pay | Source: Ambulatory Visit | Attending: Surgery

## 2018-07-20 ENCOUNTER — Other Ambulatory Visit: Payer: Self-pay

## 2018-07-20 ENCOUNTER — Ambulatory Visit (HOSPITAL_BASED_OUTPATIENT_CLINIC_OR_DEPARTMENT_OTHER): Payer: Managed Care, Other (non HMO) | Admitting: Certified Registered"

## 2018-07-20 ENCOUNTER — Ambulatory Visit
Admission: RE | Admit: 2018-07-20 | Discharge: 2018-07-20 | Disposition: A | Discharge status: Home / Self Care | Payer: Managed Care, Other (non HMO) | Source: Ambulatory Visit | Attending: Surgery | Admitting: Surgery

## 2018-07-20 ENCOUNTER — Encounter (HOSPITAL_BASED_OUTPATIENT_CLINIC_OR_DEPARTMENT_OTHER): Payer: Self-pay | Admitting: Emergency Medicine

## 2018-07-20 ENCOUNTER — Ambulatory Visit (HOSPITAL_BASED_OUTPATIENT_CLINIC_OR_DEPARTMENT_OTHER)
Admission: RE | Admit: 2018-07-20 | Discharge: 2018-07-20 | Disposition: A | Discharge status: Home / Self Care | Payer: Managed Care, Other (non HMO) | Source: Ambulatory Visit | Attending: Surgery | Admitting: Surgery

## 2018-07-20 DIAGNOSIS — G4733 Obstructive sleep apnea (adult) (pediatric): Secondary | ICD-10-CM | POA: Diagnosis not present

## 2018-07-20 DIAGNOSIS — N6032 Fibrosclerosis of left breast: Secondary | ICD-10-CM | POA: Diagnosis not present

## 2018-07-20 DIAGNOSIS — N6320 Unspecified lump in the left breast, unspecified quadrant: Secondary | ICD-10-CM

## 2018-07-20 DIAGNOSIS — Z79899 Other long term (current) drug therapy: Secondary | ICD-10-CM | POA: Insufficient documentation

## 2018-07-20 DIAGNOSIS — Z7982 Long term (current) use of aspirin: Secondary | ICD-10-CM | POA: Diagnosis not present

## 2018-07-20 DIAGNOSIS — I10 Essential (primary) hypertension: Secondary | ICD-10-CM | POA: Diagnosis not present

## 2018-07-20 DIAGNOSIS — K219 Gastro-esophageal reflux disease without esophagitis: Secondary | ICD-10-CM | POA: Insufficient documentation

## 2018-07-20 HISTORY — PX: RADIOACTIVE SEED GUIDED EXCISIONAL BREAST BIOPSY: SHX6490

## 2018-07-20 HISTORY — DX: Unspecified osteoarthritis, unspecified site: M19.90

## 2018-07-20 SURGERY — RADIOACTIVE SEED GUIDED BREAST BIOPSY
Anesthesia: General | Site: Breast | Laterality: Left

## 2018-07-20 MED ORDER — PROPOFOL 10 MG/ML IV BOLUS
INTRAVENOUS | Status: DC | PRN
  Administered 2018-07-20: 200 mg via INTRAVENOUS
  Administered 2018-07-20: 100 mg via INTRAVENOUS

## 2018-07-20 MED ORDER — CELECOXIB 200 MG PO CAPS
ORAL_CAPSULE | ORAL | Status: AC
  Filled 2018-07-20: qty 1

## 2018-07-20 MED ORDER — SCOPOLAMINE 1 MG/3DAYS TD PT72
MEDICATED_PATCH | TRANSDERMAL | Status: AC
  Filled 2018-07-20: qty 1

## 2018-07-20 MED ORDER — GLYCOPYRROLATE 0.2 MG/ML IJ SOLN
INTRAMUSCULAR | Status: DC | PRN
  Administered 2018-07-20: 0.1 mg via INTRAVENOUS

## 2018-07-20 MED ORDER — GLYCOPYRROLATE PF 0.2 MG/ML IJ SOSY
PREFILLED_SYRINGE | INTRAMUSCULAR | Status: AC
  Filled 2018-07-20: qty 1

## 2018-07-20 MED ORDER — BUPIVACAINE LIPOSOME 1.3 % IJ SUSP: 20.0000 mL | Freq: Once | INTRAMUSCULAR | Status: DC

## 2018-07-20 MED ORDER — PROPOFOL 10 MG/ML IV BOLUS
INTRAVENOUS | Status: AC
  Filled 2018-07-20: qty 20

## 2018-07-20 MED ORDER — MIDAZOLAM HCL 2 MG/2ML IJ SOLN
INTRAMUSCULAR | Status: AC
  Filled 2018-07-20: qty 2

## 2018-07-20 MED ORDER — FENTANYL CITRATE (PF) 100 MCG/2ML IJ SOLN
INTRAMUSCULAR | Status: AC
  Filled 2018-07-20: qty 2

## 2018-07-20 MED ORDER — HEPARIN SODIUM (PORCINE) 5000 UNIT/ML IJ SOLN
INTRAMUSCULAR | Status: AC
  Filled 2018-07-20: qty 1

## 2018-07-20 MED ORDER — FENTANYL CITRATE (PF) 100 MCG/2ML IJ SOLN
50.0000 ug | INTRAMUSCULAR | Status: DC | PRN
  Administered 2018-07-20: 50 ug via INTRAVENOUS
  Administered 2018-07-20: 100 ug via INTRAVENOUS

## 2018-07-20 MED ORDER — LIDOCAINE HCL (CARDIAC) PF 100 MG/5ML IV SOSY
PREFILLED_SYRINGE | INTRAVENOUS | Status: AC
  Filled 2018-07-20: qty 5

## 2018-07-20 MED ORDER — CHLORHEXIDINE GLUCONATE CLOTH 2 % EX PADS: 6.0000 | MEDICATED_PAD | Freq: Once | CUTANEOUS | Status: DC

## 2018-07-20 MED ORDER — FENTANYL CITRATE (PF) 100 MCG/2ML IJ SOLN
25.0000 ug | INTRAMUSCULAR | Status: DC | PRN
  Administered 2018-07-20 (×2): 25 ug via INTRAVENOUS

## 2018-07-20 MED ORDER — LACTATED RINGERS IV SOLN
INTRAVENOUS | Status: DC
  Administered 2018-07-20: 12:00:00 via INTRAVENOUS

## 2018-07-20 MED ORDER — MIDAZOLAM HCL 2 MG/2ML IJ SOLN
1.0000 mg | INTRAMUSCULAR | Status: DC | PRN
  Administered 2018-07-20: 2 mg via INTRAVENOUS

## 2018-07-20 MED ORDER — GABAPENTIN 300 MG PO CAPS
ORAL_CAPSULE | ORAL | Status: AC
  Filled 2018-07-20: qty 1

## 2018-07-20 MED ORDER — CEFAZOLIN SODIUM-DEXTROSE 2-4 GM/100ML-% IV SOLN
INTRAVENOUS | Status: AC
  Filled 2018-07-20: qty 100

## 2018-07-20 MED ORDER — EPHEDRINE SULFATE 50 MG/ML IJ SOLN
INTRAMUSCULAR | Status: DC | PRN
  Administered 2018-07-20 (×3): 10 mg via INTRAVENOUS

## 2018-07-20 MED ORDER — HYDROCODONE-ACETAMINOPHEN 5-325 MG PO TABS: 1.0000 | ORAL_TABLET | Freq: Four times a day (QID) | ORAL | 0 refills | Status: DC | PRN

## 2018-07-20 MED ORDER — SCOPOLAMINE 1 MG/3DAYS TD PT72
1.0000 | MEDICATED_PATCH | Freq: Once | TRANSDERMAL | Status: DC | PRN
  Administered 2018-07-20: 1.5 mg via TRANSDERMAL

## 2018-07-20 MED ORDER — CEFAZOLIN SODIUM-DEXTROSE 2-4 GM/100ML-% IV SOLN
2.0000 g | INTRAVENOUS | Status: AC
  Administered 2018-07-20: 2 g via INTRAVENOUS

## 2018-07-20 MED ORDER — ACETAMINOPHEN 500 MG PO TABS
1000.0000 mg | ORAL_TABLET | ORAL | Status: AC
  Administered 2018-07-20: 1000 mg via ORAL

## 2018-07-20 MED ORDER — 0.9 % SODIUM CHLORIDE (POUR BTL) OPTIME
TOPICAL | Status: DC | PRN
  Administered 2018-07-20: 1000 mL

## 2018-07-20 MED ORDER — ONDANSETRON HCL 4 MG/2ML IJ SOLN
INTRAMUSCULAR | Status: DC | PRN
  Administered 2018-07-20: 4 mg via INTRAVENOUS

## 2018-07-20 MED ORDER — PROMETHAZINE HCL 25 MG/ML IJ SOLN: 6.2500 mg | INTRAMUSCULAR | Status: DC | PRN

## 2018-07-20 MED ORDER — ACETAMINOPHEN 500 MG PO TABS
ORAL_TABLET | ORAL | Status: AC
  Filled 2018-07-20: qty 2

## 2018-07-20 MED ORDER — CELECOXIB 200 MG PO CAPS
200.0000 mg | ORAL_CAPSULE | ORAL | Status: AC
  Administered 2018-07-20: 200 mg via ORAL

## 2018-07-20 MED ORDER — BUPIVACAINE-EPINEPHRINE (PF) 0.25% -1:200000 IJ SOLN
INTRAMUSCULAR | Status: AC
  Filled 2018-07-20: qty 30

## 2018-07-20 MED ORDER — GABAPENTIN 300 MG PO CAPS
300.0000 mg | ORAL_CAPSULE | ORAL | Status: AC
  Administered 2018-07-20: 300 mg via ORAL

## 2018-07-20 MED ORDER — SUCCINYLCHOLINE CHLORIDE 20 MG/ML IJ SOLN
INTRAMUSCULAR | Status: DC | PRN
  Administered 2018-07-20: 100 mg via INTRAVENOUS

## 2018-07-20 MED ORDER — HEPARIN SODIUM (PORCINE) 5000 UNIT/ML IJ SOLN: 5000.0000 [IU] | Freq: Once | INTRAMUSCULAR | Status: DC

## 2018-07-20 MED ORDER — EPHEDRINE SULFATE 50 MG/ML IJ SOLN
INTRAMUSCULAR | Status: AC
Start: 2018-07-20 — End: ?
  Filled 2018-07-20: qty 1

## 2018-07-20 SURGICAL SUPPLY — 39 items
ADH SKN CLS APL DERMABOND .7 (GAUZE/BANDAGES/DRESSINGS) ×1
BINDER BREAST XXLRG (GAUZE/BANDAGES/DRESSINGS) ×2 IMPLANT
BLADE SURG 10 STRL SS (BLADE) ×3 IMPLANT
BLADE SURG 15 STRL LF DISP TIS (BLADE) ×1 IMPLANT
BLADE SURG 15 STRL SS (BLADE) ×3
COVER BACK TABLE 60X90IN (DRAPES) ×3 IMPLANT
COVER MAYO STAND STRL (DRAPES) ×3 IMPLANT
COVER PROBE W GEL 5X96 (DRAPES) ×3 IMPLANT
DERMABOND ADVANCED (GAUZE/BANDAGES/DRESSINGS) ×2
DERMABOND ADVANCED .7 DNX12 (GAUZE/BANDAGES/DRESSINGS) ×1 IMPLANT
DEVICE DUBIN W/COMP PLATE 8390 (MISCELLANEOUS) ×3 IMPLANT
DRAPE LAPAROTOMY 100X72 PEDS (DRAPES) ×3 IMPLANT
DRSG PAD ABDOMINAL 8X10 ST (GAUZE/BANDAGES/DRESSINGS) ×2 IMPLANT
ELECT COATED BLADE 2.86 ST (ELECTRODE) ×3 IMPLANT
ELECT REM PT RETURN 9FT ADLT (ELECTROSURGICAL) ×3
ELECTRODE REM PT RTRN 9FT ADLT (ELECTROSURGICAL) ×1 IMPLANT
GAUZE SPONGE 4X4 12PLY STRL LF (GAUZE/BANDAGES/DRESSINGS) IMPLANT
GLOVE BIO SURGEON STRL SZ8 (GLOVE) ×3 IMPLANT
GOWN STRL REUS W/ TWL LRG LVL3 (GOWN DISPOSABLE) ×1 IMPLANT
GOWN STRL REUS W/ TWL XL LVL3 (GOWN DISPOSABLE) ×1 IMPLANT
GOWN STRL REUS W/TWL LRG LVL3 (GOWN DISPOSABLE) ×3
GOWN STRL REUS W/TWL XL LVL3 (GOWN DISPOSABLE) ×3
KIT MARKER MARGIN INK (KITS) ×3 IMPLANT
NDL HYPO 25X1 1.5 SAFETY (NEEDLE) ×1 IMPLANT
NEEDLE HYPO 25X1 1.5 SAFETY (NEEDLE) ×3 IMPLANT
NS IRRIG 1000ML POUR BTL (IV SOLUTION) ×2 IMPLANT
PACK BASIN DAY SURGERY FS (CUSTOM PROCEDURE TRAY) ×3 IMPLANT
PENCIL BUTTON HOLSTER BLD 10FT (ELECTRODE) ×3 IMPLANT
SCRUB TECHNI CARE 4 OZ NO DYE (MISCELLANEOUS) ×3 IMPLANT
SLEEVE SCD COMPRESS KNEE MED (MISCELLANEOUS) ×3 IMPLANT
SPONGE LAP 18X18 RF (DISPOSABLE) ×3 IMPLANT
SUT MNCRL AB 4-0 PS2 18 (SUTURE) ×2 IMPLANT
SUT VICRYL 3-0 CR8 SH (SUTURE) ×3 IMPLANT
SYR CONTROL 10ML LL (SYRINGE) ×3 IMPLANT
TOWEL GREEN STERILE FF (TOWEL DISPOSABLE) ×3 IMPLANT
TOWEL OR NON WOVEN STRL DISP B (DISPOSABLE) ×3 IMPLANT
TUBE CONNECTING 20'X1/4 (TUBING) ×1
TUBE CONNECTING 20X1/4 (TUBING) ×2 IMPLANT
YANKAUER SUCT BULB TIP NO VENT (SUCTIONS) ×3 IMPLANT

## 2018-07-20 NOTE — Anesthesia Preprocedure Evaluation (Signed)
Anesthesia Evaluation  Patient identified by MRN, date of birth, ID band Patient awake    Reviewed: Allergy & Precautions, NPO status , Patient's Chart, lab work & pertinent test results, reviewed documented beta blocker date and time   History of Anesthesia Complications (+) PONV and history of anesthetic complications  Airway Mallampati: II  TM Distance: >3 FB Neck ROM: Full    Dental  (+) Teeth Intact, Dental Advisory Given   Pulmonary asthma , sleep apnea ,    Pulmonary exam normal breath sounds clear to auscultation       Cardiovascular hypertension, Pt. on medications and Pt. on home beta blockers Normal cardiovascular exam Rhythm:Regular Rate:Normal     Neuro/Psych  Headaches, negative psych ROS   GI/Hepatic Neg liver ROS, GERD  ,  Endo/Other  Obesity   Renal/GU negative Renal ROS     Musculoskeletal  (+) Arthritis , Osteoarthritis,    Abdominal   Peds  Hematology negative hematology ROS (+)   Anesthesia Other Findings Day of surgery medications reviewed with the patient.  Reproductive/Obstetrics                             Anesthesia Physical Anesthesia Plan  ASA: II  Anesthesia Plan: General   Post-op Pain Management:    Induction: Intravenous  PONV Risk Score and Plan: 4 or greater and Scopolamine patch - Pre-op, Midazolam, Dexamethasone and Ondansetron  Airway Management Planned: Oral ETT  Additional Equipment:   Intra-op Plan:   Post-operative Plan: Extubation in OR  Informed Consent: I have reviewed the patients History and Physical, chart, labs and discussed the procedure including the risks, benefits and alternatives for the proposed anesthesia with the patient or authorized representative who has indicated his/her understanding and acceptance.   Dental advisory given  Plan Discussed with: CRNA  Anesthesia Plan Comments:         Anesthesia Quick  Evaluation

## 2018-07-20 NOTE — Anesthesia Postprocedure Evaluation (Signed)
Anesthesia Post Note  Patient: Norma [name]  Procedure(s) Performed: RADIOACTIVE SEED GUIDED EXCISIONAL BREAST BIOPSY (Left Breast)     Patient location during evaluation: PACU Anesthesia Type: General Level of consciousness: awake and alert Pain management: pain level controlled Vital Signs Assessment: post-procedure vital signs reviewed and stable Respiratory status: spontaneous breathing, nonlabored ventilation and respiratory function stable Cardiovascular status: blood pressure returned to baseline and stable Postop Assessment: no apparent nausea or vomiting Anesthetic complications: no    Last Vitals:  Vitals:   07/20/18 1520 07/20/18 1543  BP:  132/69  Pulse: 86 73  Resp: (!) 24 18  Temp:  36.6 C  SpO2: 99% 98%    Last Pain:  Vitals:   07/20/18 1543  TempSrc:   PainSc: 2                  Cecile HearingStephen Edward Taysom Glymph

## 2018-07-20 NOTE — Discharge Instructions (Signed)
Breast Biopsy, Care After These instructions give you information about caring for yourself after your procedure. Your doctor may also give you more specific instructions. Call your doctor if you have any problems or questions after your procedure. Follow these instructions at home: Medicines  Take over-the-counter and prescription medicines only as told by your doctor.  Do not drive for 24 hours if you received a sedative.  Do not drink alcohol while taking pain medicine.  Do not drive or use heavy machinery while taking prescription pain medicine. Biopsy Site Care   Follow instructions from your doctor about how to take care of your cut from surgery (incision) or puncture area. Make sure you: ? Wash your hands with soap and water before you change your bandage. If you cannot use soap and water, use hand sanitizer. ? Change any bandages (dressings) as told by your doctor. ? Leave any stitches (sutures), skin glue, or skin tape (adhesive) strips in place. They may need to stay in place for 2 weeks or longer. If tape strips get loose and curl up, you may trim the loose edges. Do not remove tape strips completely unless your doctor says it is okay.  If you have stitches, keep them dry when you take a bath or a shower.  Check your cut or puncture area every day for signs of infection. Check for: ? More redness, swelling, or pain. ? More fluid or blood. ? Warmth. ? Pus or a bad smell.  Protect the biopsy area. Do not let the area get bumped. Activity  Avoid activities that could pull the biopsy site open. ? Avoid stretching. ? Avoid reaching. ? Avoid exercise. ? Avoid sports. ? Avoid lifting anything that is heavier than 3 pounds (1.4 kg).  Return to your normal activities as told by your doctor. Ask your doctor what activities are safe for you. General instructions  Continue your normal diet.  Wear a good support bra for as long as told by your doctor.  Get checked for extra  fluid in your body (lymphedema) as often as told by your doctor.  Keep all follow-up visits as told by your doctor. This is important. Contact a health care provider if:  You have more redness, swelling, or pain at the biopsy site.  You have more fluid or blood coming from your biopsy site.  Your biopsy site feels warm to the touch.  You have pus or a bad smell coming from the biopsy site.  Your biopsy site breaks open after the stitches, staples, or skin tape strips have been removed.  You have a rash.  You have a fever. Get help right away if:  You have more bleeding (more than a small spot) from the biopsy site.  You have trouble breathing.  You have red streaks around the biopsy site. This information is not intended to replace advice given to you by your health care provider. Make sure you discuss any questions you have with your health care provider. Document Released: 10/08/2009 Document Revised: 08/18/2016 Document Reviewed: 09/15/2015 Elsevier Interactive Patient Education  2018 [employer].  **You had 1000 mg of Tylenol at 10:50 AM **No Ibuprofen until after 7:00 PM day of discharge   Post Anesthesia Home Care Instructions  Activity: Get plenty of rest for the remainder of the day. A responsible individual must stay with you for 24 hours following the procedure.  For the next 24 hours, DO NOT: -Drive a car -Advertising copywriter -Drink alcoholic beverages -Take any medication  unless instructed by your physician -Make any legal decisions or sign important papers.  Meals: Start with liquid foods such as gelatin or soup. Progress to regular foods as tolerated. Avoid greasy, spicy, heavy foods. If nausea and/or vomiting occur, drink only clear liquids until the nausea and/or vomiting subsides. Call your physician if vomiting continues.  Special Instructions/Symptoms: Your throat may feel dry or sore from the anesthesia or the breathing tube placed in your throat  during surgery. If this causes discomfort, gargle with warm salt water. The discomfort should disappear within 24 hours.  If you had a scopolamine patch placed behind your ear for the management of post- operative nausea and/or vomiting:  1. The medication in the patch is effective for 72 hours, after which it should be removed.  Wrap patch in a tissue and discard in the trash. Wash hands thoroughly with soap and water. 2. You may remove the patch earlier than 72 hours if you experience unpleasant side effects which may include dry mouth, dizziness or visual disturbances. 3. Avoid touching the patch. Wash your hands with soap and water after contact with the patch.

## 2018-07-20 NOTE — Anesthesia Procedure Notes (Signed)
Procedure Name: Intubation Date/Time: 07/20/2018 1:12 PM Performed by: Signe Colt, CRNA Pre-anesthesia Checklist: Patient identified, Emergency Drugs available, Suction available and Patient being monitored Patient Re-evaluated:Patient Re-evaluated prior to induction Oxygen Delivery Method: Circle system utilized Preoxygenation: Pre-oxygenation with 100% oxygen Induction Type: IV induction Ventilation: Mask ventilation without difficulty Laryngoscope Size: Mac and 3 Grade View: Grade I Tube type: Oral Number of attempts: 1 Airway Equipment and Method: Stylet and Oral airway Placement Confirmation: ETT inserted through vocal cords under direct vision,  positive ETCO2 and breath sounds checked- equal and bilateral Secured at: 21 cm Tube secured with: Tape Dental Injury: Teeth and Oropharynx as per pre-operative assessment

## 2018-07-20 NOTE — Transfer of Care (Signed)
Immediate Anesthesia Transfer of Care Note  Patient: Norma [name]  Procedure(s) Performed: RADIOACTIVE SEED GUIDED EXCISIONAL BREAST BIOPSY (Left Breast)  Patient Location: PACU  Anesthesia Type:General  Level of Consciousness: awake, alert , oriented and patient cooperative  Airway & Oxygen Therapy: Patient Spontanous Breathing and Patient connected to face mask oxygen  Post-op Assessment: Report given to RN and Post -op Vital signs reviewed and stable  Post vital signs: Reviewed and stable  Last Vitals:  Vitals Value Taken Time  BP    Temp    Pulse 108 07/20/2018  2:14 PM  Resp 29 07/20/2018  2:14 PM  SpO2 98 % 07/20/2018  2:14 PM  Vitals shown include unvalidated device data.  Last Pain:  Vitals:   07/20/18 1043  TempSrc: Oral         Complications: No apparent anesthesia complications

## 2018-07-20 NOTE — Op Note (Signed)
Norma [name]  07/06/1961 20 July 2018   PCP:  Johny BlamerHarris, William, MD   Surgeon: Wenda LowMatt Dianelys Scinto, MD, FACS  Asst:  none  Anes:  general  Preop Dx: Left breast mass Postop Dx: Left breast mass-path pending  Procedure: Radioactive seed localized left breast biopsy Location Surgery: [facility] 8 Complications: none  EBL:   minimal cc  Drains: none  Description of Procedure:  The patient was taken to OR 8 .  After anesthesia was administered and the patient was prepped a timeout was performed.  Radio hot spot noted at 9 oclock position about 2 cm from the areolar margin.  Transverse (radial) incision and carried into the fat.  Superior, inferior, medial and lateral flaps generated.  Got beneath a deep margin using the radioprobe.  Mass excised en toto and Faxitron showed the clip and the radioactive seed.  Bleeding controlled and irrigated with saline.  Closed with vicryl and monocryl and Dermabond.    The patient tolerated the procedure well and was taken to the PACU in stable condition.     Matt B. Daphine DeutscherMartin, MD, [facility] [place] [phone]

## 2018-07-20 NOTE — Interval H&P Note (Signed)
History and Physical Interval Note:  07/20/2018 12:46 PM  Norma DeisPatricia E [name]  has presented today for surgery, with the diagnosis of LEFT BREAST MASS  The various methods of treatment have been discussed with the patient and family. After consideration of risks, benefits and other options for treatment, the patient has consented to  Procedure(s): RADIOACTIVE SEED GUIDED EXCISIONAL BREAST BIOPSY (Left) as a surgical intervention .  The patient's history has been reviewed, patient examined, no change in status, stable for surgery.  I have reviewed the patient's chart and labs.  Questions were answered to the patient's satisfaction.     Valarie MerinoMatthew B Tanav Orsak

## 2018-07-23 ENCOUNTER — Encounter (HOSPITAL_BASED_OUTPATIENT_CLINIC_OR_DEPARTMENT_OTHER): Payer: Self-pay | Admitting: Surgery

## 2020-03-05 ENCOUNTER — Other Ambulatory Visit: Payer: Self-pay | Admitting: Family Medicine

## 2020-03-05 DIAGNOSIS — Z1231 Encounter for screening mammogram for malignant neoplasm of breast: Secondary | ICD-10-CM

## 2020-03-31 ENCOUNTER — Other Ambulatory Visit: Payer: Self-pay

## 2020-03-31 ENCOUNTER — Ambulatory Visit
Admission: RE | Admit: 2020-03-31 | Discharge: 2020-03-31 | Disposition: A | Discharge status: Home / Self Care | Payer: Managed Care, Other (non HMO) | Source: Ambulatory Visit | Attending: Family Medicine | Admitting: Family Medicine

## 2020-03-31 DIAGNOSIS — Z1231 Encounter for screening mammogram for malignant neoplasm of breast: Secondary | ICD-10-CM

## 2020-05-12 NOTE — Progress Notes (Addendum)
[facility]    Provider:  Dr Lucia Gaskins Requesting Provider: Dan Europe PA Primary Care Provider:  Johny Blamer, MD  CC:  Balance disorder, feeling "off kilter"  HPI:  Norma [name] is a 58 y.o. female here as requested by Dan Europe PA for balance disorder.  She has a past medical history of sleep apnea, diabetes, PVCs and palpitations, paresthesias, obesity, hyperlipidemia, hypertension, headache, asthma, arthritis, costochondritis. I reviewed Clovis Cao, loise PA's notes; she states that patient has been dizzy and it comes and goes for years.  Duration of several years, worsening over the past 3 months, imbalance that primary occurs with walking or turning around.  She feels as though she is pulled to one side or the other.  She has seen neurology in the past but not for imbalance, denies a spinning sensation, she had her hearing checked about 2 years ago at another office, mild hearing loss was noted and she does not feel the hearing is changed significantly since then.  She reports mild nonpulsatile tinnitus in both ears.  She was corrective lenses up-to-date, she does have cervical disc disease and paresthesias about the upper extremities associated with driving (she no longer drives) and takes gabapentin and Flexeril.  She was recently diagnosed with type 2 diabetes and is taking Metformin and other medications.  She is referred for evaluation.  Patient reports she has episodes that may last for a few weeks or few months then goes away for a while then comes back.  Started out when she was turning around, she would lose her balance, a couple months ago she started staggering while walking straight but has not happened much in the last couple of weeks.  For several years she has had a loss of balance, if she turns really quick, it comes and goes, it may last a few weeks, no room spinning, nor lightheaded or feeling like she is going to pass out, movement triggers  it, a few months ago just walking she would "stagger", she has tinnitus for a long time, no numbness, she has a chronic pinched nerve in her neck she had a NCS in the past but that is chronic and not associated. No double vision but her vision gets blurry and the eye doctor thought it was eye fatigue. She notices more when she turns to the right but also to the left. She was evaluated by ENT for inner ear problems. No inciting events, ongoing for several years, stable. No nausea or vomiting or falls. It happens and then it is gone. It can last a few weeks but not consistently, mostly with turning around. But the new symptoms is staggering occ. No other focal neurologic deficits, associated symptoms, inciting events or modifiable factors.  Reviewed notes, labs and imaging from outside physicians, which showed:  MRI 2014: personally reviewed images and agree with brain findings  IMPRESSION: No acute intracranial process.  Mild nonspecific white matter changes, progressed from prior examination in a nonspecific distribution.  Differential diagnosis includes chronic small vessel ischemic disease, sequelae of migrainous disorder, prior infectious or inflammatory process.  Review of Systems: Patient complains of symptoms per HPI as well as the following symptoms: imbalance, weakness. Pertinent negatives and positives per HPI. All others negative.   Social History   Socioeconomic History  . Marital status: Married    Spouse name: Not on file  . Number of children: 4  . Years of education: Not on file  . Highest education level: Bachelor's degree (e.g.,  BA, AB, BS)  Occupational History  . Not on file  Tobacco Use  . Smoking status: Never Smoker  . Smokeless tobacco: Never Used  Substance and Sexual Activity  . Alcohol use: No  . Drug use: No  . Sexual activity: Not Currently    Birth control/protection: Post-menopausal  Other Topics Concern  . Not on file  Social History Narrative    Lives at home with husband    Right handed   Caffeine: none    Social Determinants of Health   Financial Resource Strain:   . Difficulty of Paying Living Expenses:   Food Insecurity:   . Worried About Programme researcher, broadcasting/film/video in the Last Year:   . Barista in the Last Year:   Transportation Needs:   . Freight forwarder (Medical):   Marland Kitchen Lack of Transportation (Non-Medical):   Physical Activity:   . Days of Exercise per Week:   . Minutes of Exercise per Session:   Stress:   . Feeling of Stress :   Social Connections:   . Frequency of Communication with Friends and Family:   . Frequency of Social Gatherings with Friends and Family:   . Attends Religious Services:   . Active Member of Clubs or Organizations:   . Attends Banker Meetings:   Marland Kitchen Marital Status:   Intimate Partner Violence:   . Fear of Current or Ex-Partner:   . Emotionally Abused:   Marland Kitchen Physically Abused:   . Sexually Abused:     Family History  Problem Relation Age of Onset  . Other Mother        benign brain tumor   . Stroke Father   . Heart attack Father   . Multiple sclerosis Niece     Past Medical History:  Diagnosis Date  . Arthritis    hips  . Asthma   . Costochondritis   . Diabetes (HCC)   . GERD (gastroesophageal reflux disease)   . Headache   . HTN (hypertension)   . Hyperlipidemia   . Obesity   . Palpitations   . Paresthesias    on Neurontin  . Pinched nerve in neck    pt thinks it's between C3 and C4  . PONV (postoperative nausea and vomiting)   . PVC (premature ventricular contraction)   . Sleep apnea    on CPAP; now moderate.     Patient Active Problem List   Diagnosis Date Noted  . Imbalance 05/13/2020  . PVC (premature ventricular contraction) 12/07/2011  . URI (upper respiratory infection) 12/07/2011  . Mixed hyperlipidemia 12/07/2011  . Dizzy 12/07/2011    Past Surgical History:  Procedure Laterality Date  . BREAST MASS EXCISION Left   . CESAREAN  SECTION    . CHOLECYSTECTOMY N/A 09/23/2016   Procedure: LAPAROSCOPIC CHOLECYSTECTOMY WITH INTRAOPERATIVE CHOLANGIOGRAM;  Surgeon: Luretha Murphy, MD;  Location: [facility];  Service: General;  Laterality: N/A;  . EYE SURGERY     to correct cross sidedness   . novasure procedure     . RADIOACTIVE SEED GUIDED EXCISIONAL BREAST BIOPSY Left 07/20/2018   Procedure: RADIOACTIVE SEED GUIDED EXCISIONAL BREAST BIOPSY;  Surgeon: Luretha Murphy, MD;  Location:  SURGERY CENTER;  Service: General;  Laterality: Left;  . TUBAL LIGATION      Current Outpatient Medications  Medication Sig Dispense Refill  . albuterol (PROVENTIL HFA;VENTOLIN HFA) 108 (90 Base) MCG/ACT inhaler Inhale 2 puffs into the lungs every 6 (six) hours as needed for  wheezing or shortness of breath.    Marland Kitchen aspirin 81 MG tablet Take 81 mg by mouth daily.      Marland Kitchen CINNAMON PO Take 1,000 mg by mouth daily.    . cyclobenzaprine (FLEXERIL) 5 MG tablet Take 5 mg by mouth at bedtime as needed for muscle spasms.    Marland Kitchen escitalopram (LEXAPRO) 5 MG tablet Take 10 mg by mouth daily.     Marland Kitchen gabapentin (NEURONTIN) 600 MG tablet Take 600 mg by mouth at bedtime.     Marland Kitchen HYDROcodone-acetaminophen (NORCO/VICODIN) 5-325 MG tablet Take 1 tablet by mouth every 6 (six) hours as needed for moderate pain. 20 tablet 0  . ibuprofen (ADVIL,MOTRIN) 800 MG tablet Take 800 mg by mouth every 8 (eight) hours as needed for pain.    . Lancets (ONETOUCH DELICA PLUS QQPYPP50D) MISC Apply 1 each topically daily.    Marland Kitchen lisinopril (PRINIVIL,ZESTRIL) 40 MG tablet Take 40 mg by mouth daily.    . metFORMIN (GLUCOPHAGE-XR) 500 MG 24 hr tablet Take 500 mg by mouth 2 (two) times daily.    . metoprolol succinate (TOPROL-XL) 50 MG 24 hr tablet Take 50 mg by mouth every evening.     Marland Kitchen omeprazole (PRILOSEC) 20 MG capsule Take 20 mg by mouth daily.    Glory Rosebush VERIO test strip 1 each daily.    . rosuvastatin (CRESTOR) 5 MG tablet Take 5 mg by mouth once a week.     . RYBELSUS 3 MG  TABS Take 1 tablet by mouth every morning.    Marland Kitchen VITAMIN D PO Take 5,000 Int'l Units by mouth daily.     No current facility-administered medications for this visit.    Allergies as of 05/13/2020 - Review Complete 05/13/2020  Allergen Reaction Noted  . Beconase aq [beclomethasone diprop monohyd] Shortness Of Breath 09/21/2016  . Atenolol Other (See Comments) 09/21/2016  . Other  09/21/2016  . Cephalexin Rash 12/07/2011  . Codeine Itching and Rash 12/07/2011    Vitals: BP 140/83 (BP Location: Left Arm, Patient Position: Sitting)   Pulse 63   Ht 5\' 4"  (1.626 m)   Wt 202 lb (91.6 kg)   LMP  (LMP Unknown)   BMI 34.67 kg/m  Last Weight:  Wt Readings from Last 1 Encounters:  05/13/20 202 lb (91.6 kg)   Last Height:   Ht Readings from Last 1 Encounters:  05/13/20 5\' 4"  (1.626 m)     Physical exam: Exam: Gen: NAD, conversant, well nourised, obese, well groomed                     CV: RRR, no +SEM. No Carotid Bruits. No peripheral edema, warm, nontender Eyes: Conjunctivae clear without exudates or hemorrhage  Neuro: Detailed Neurologic Exam  Speech:    Speech is normal; fluent and spontaneous with normal comprehension.  Cognition:    The patient is oriented to person, place, and time;     recent and remote memory intact;     language fluent;     normal attention, concentration,     fund of knowledge Cranial Nerves:    The pupils are equal, round, and reactive to light. The fundi are flat. Visual fields are full to finger confrontation. Extraocular movements are intact. Trigeminal sensation is intact and the muscles of mastication are normal. The face is symmetric. The palate elevates in the midline. Hearing intact. Voice is normal. Shoulder shrug is normal. The tongue has normal motion without fasciculations.   Coordination:  Normal finger to nose and heel to shin.   Gait:    Heel-toe and tandem gait with imbalance.   Motor Observation:    No asymmetry, no atrophy,  and no involuntary movements noted. Tone:    Normal muscle tone.    Posture:    Posture is normal. normal erect    Strength: weakness right deltoid and triceps 4/5 otherwise strength is V/V in the upper and lower limbs.      Sensation: intact to LT, sway with romberg     Reflex Exam:  DTR's:    Deep tendon reflexes in the upper and lower extremities are brisk bilaterally, more so right biceps.   Toes:    The toes are downgoing bilaterally.   Clonus:    3 beats AJs    Assessment/Plan:  59 y.o. female here as requested by Dan Europe PA for balance disorder.  She has a past medical history of sleep apnea, diabetes, PVCs and palpitations, paresthesias, obesity, hyperlipidemia, hypertension, headache, asthma, arthritis, costochondritis.  Patient has imbalance on tandem and cannot stand on one leg. She has sway with Romberg. Need to check for upper motor neuron lesions of the brain and cervical spine. Also physical therapy may help. She has a history of radiculopathy and neck problems. Need to evaluate for cervical myelopathy and also need to rule out stroke or schwannoma or other. Thin cuts through the IAC.  Discussed fall precautions, will see what workup reveals before deciding follow up  Addendum 06/08/2020: I reviewed MRI of the cervical spine report from [facility] dated June 04, 2020.  Impression showed mild cervical spondylosis with several small posterior disc osteophyte complexes.  No spinal or foraminal stenosis.  No spinal cord abnormality.  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  For details please see scanned report.  I reviewed MRI of the brain with thin cuts through the internal auditory canals: No definitive acute intracranial abnormality appreciated, multiple small discrete white matter lesions identified, nonspecific usually resulting from the benign remote incidental causes such as chronic ischemic microvascular disease or migraines.  Unremarkable MRI temporal  bones.  B[place] please call patient and summarized as follows: Patient has arthritic changes in the cervical spine not inconsistent with age, however the spinal cord looks normal and there are no pinched nerves.  MRI of the brain is unremarkable, no strokes or lesions or masses, nothing seen on either scan to account for her imbalance or dizziness.  This is good news, I am glad that there were no significant findings, unfortunately this means that there is really nothing that I can do neurologically to address her symptoms, I did discuss physical therapy with her at last appointment and I think this would probably be the best for her for her imbalance, dizziness and episodes possibly something that combines vestibular therapy with balance exercises.    Orders Placed This Encounter  Procedures  . MR BRAIN W WO CONTRAST  . MR CERVICAL SPINE WO CONTRAST  . Comprehensive metabolic panel  . CBC  . Ambulatory referral to Physical Therapy    Cc: Aquilla Hacker, PA-C, Rickey Barbara, MD  [facility] [address] [place], [place] [address]  Phone [phone] Fax [phone]

## 2020-05-13 ENCOUNTER — Ambulatory Visit: Payer: Managed Care, Other (non HMO) | Admitting: Neurology

## 2020-05-13 ENCOUNTER — Other Ambulatory Visit: Payer: Self-pay

## 2020-05-13 ENCOUNTER — Encounter: Payer: Self-pay | Admitting: Neurology

## 2020-05-13 VITALS — BP 140/83 | HR 63 | Ht 64.0 in | Wt 202.0 lb

## 2020-05-13 DIAGNOSIS — R29898 Other symptoms and signs involving the musculoskeletal system: Secondary | ICD-10-CM | POA: Diagnosis not present

## 2020-05-13 DIAGNOSIS — R269 Unspecified abnormalities of gait and mobility: Secondary | ICD-10-CM | POA: Diagnosis not present

## 2020-05-13 DIAGNOSIS — R258 Other abnormal involuntary movements: Secondary | ICD-10-CM

## 2020-05-13 DIAGNOSIS — R292 Abnormal reflex: Secondary | ICD-10-CM

## 2020-05-13 DIAGNOSIS — R270 Ataxia, unspecified: Secondary | ICD-10-CM

## 2020-05-13 DIAGNOSIS — R2689 Other abnormalities of gait and mobility: Secondary | ICD-10-CM | POA: Diagnosis not present

## 2020-05-13 NOTE — Addendum Note (Signed)
Addended by: Naomie Dean B on: 05/13/2020 08:39 AM   Modules accepted: Orders

## 2020-05-13 NOTE — Patient Instructions (Signed)
MRI of the brain and cervical spine Blood work   Location manager in the Home, Adult Falls can cause injuries. They can happen to people of all ages. There are many things you can do to make your home safe and to help prevent falls. Ask for help when making these changes, if needed. What actions can I take to prevent falls? General Instructions  Use good lighting in all rooms. Replace any light bulbs that burn out.  Turn on the lights when you go into a dark area. Use night-lights.  Keep items that you use often in easy-to-reach places. Lower the shelves around your home if necessary.  Set up your furniture so you have a clear path. Avoid moving your furniture around.  Do not have throw rugs and other things on the floor that can make you trip.  Avoid walking on wet floors.  If any of your floors are uneven, fix them.  Add color or contrast paint or tape to clearly mark and help you see: ? Any grab bars or handrails. ? First and last steps of stairways. ? Where the edge of each step is.  If you use a stepladder: ? Make sure that it is fully opened. Do not climb a closed stepladder. ? Make sure that both sides of the stepladder are locked into place. ? Ask someone to hold the stepladder for you while you use it.  If there are any pets around you, be aware of where they are. What can I do in the bathroom?      Keep the floor dry. Clean up any water that spills onto the floor as soon as it happens.  Remove soap buildup in the tub or shower regularly.  Use non-skid mats or decals on the floor of the tub or shower.  Attach bath mats securely with double-sided, non-slip rug tape.  If you need to sit down in the shower, use a plastic, non-slip stool.  Install grab bars by the toilet and in the tub and shower. Do not use towel bars as grab bars. What can I do in the bedroom?  Make sure that you have a light by your bed that is easy to reach.  Do not use any sheets or  blankets that are too big for your bed. They should not hang down onto the floor.  Have a firm chair that has side arms. You can use this for support while you get dressed. What can I do in the kitchen?  Clean up any spills right away.  If you need to reach something above you, use a strong step stool that has a grab bar.  Keep electrical cords out of the way.  Do not use floor polish or wax that makes floors slippery. If you must use wax, use non-skid floor wax. What can I do with my stairs?  Do not leave any items on the stairs.  Make sure that you have a light switch at the top of the stairs and the bottom of the stairs. If you do not have them, ask someone to add them for you.  Make sure that there are handrails on both sides of the stairs, and use them. Fix handrails that are broken or loose. Make sure that handrails are as long as the stairways.  Install non-slip stair treads on all stairs in your home.  Avoid having throw rugs at the top or bottom of the stairs. If you do have throw rugs, attach them to  the floor with carpet tape.  Choose a carpet that does not hide the edge of the steps on the stairway.  Check any carpeting to make sure that it is firmly attached to the stairs. Fix any carpet that is loose or worn. What can I do on the outside of my home?  Use bright outdoor lighting.  Regularly fix the edges of walkways and driveways and fix any cracks.  Remove anything that might make you trip as you walk through a door, such as a raised step or threshold.  Trim any bushes or trees on the path to your home.  Regularly check to see if handrails are loose or broken. Make sure that both sides of any steps have handrails.  Install guardrails along the edges of any raised decks and porches.  Clear walking paths of anything that might make someone trip, such as tools or rocks.  Have any leaves, snow, or ice cleared regularly.  Use sand or salt on walking paths during  winter.  Clean up any spills in your garage right away. This includes grease or oil spills. What other actions can I take?  Wear shoes that: ? Have a low heel. Do not wear high heels. ? Have rubber bottoms. ? Are comfortable and fit you well. ? Are closed at the toe. Do not wear open-toe sandals.  Use tools that help you move around (mobility aids) if they are needed. These include: ? Canes. ? Walkers. ? Scooters. ? Crutches.  Review your medicines with your doctor. Some medicines can make you feel dizzy. This can increase your chance of falling. Ask your doctor what other things you can do to help prevent falls. Where to find more information  Centers for Disease Control and Prevention, [employer]: HealthcareCounselor.com.pt  [employer] on Aging: RingConnections.si Contact a doctor if:  You are afraid of falling at home.  You feel weak, drowsy, or dizzy at home.  You fall at home. Summary  There are many simple things that you can do to make your home safe and to help prevent falls.  Ways to make your home safe include removing tripping hazards and installing grab bars in the bathroom.  Ask for help when making these changes in your home. This information is not intended to replace advice given to you by your health care provider. Make sure you discuss any questions you have with your health care provider. Document Revised: 04/04/2019 Document Reviewed: 07/27/2017 Elsevier Patient Education  2020 [employer].

## 2020-05-14 LAB — COMPREHENSIVE METABOLIC PANEL
ALT: 47 IU/L — ABNORMAL HIGH (ref 0–32)
AST: 32 IU/L (ref 0–40)
Albumin/Globulin Ratio: 2.1 (ref 1.2–2.2)
Albumin: 4.6 g/dL (ref 3.8–4.9)
Alkaline Phosphatase: 81 IU/L (ref 48–121)
BUN/Creatinine Ratio: 21 (ref 9–23)
BUN: 12 mg/dL (ref 6–24)
Bilirubin Total: 0.4 mg/dL (ref 0.0–1.2)
CO2: 23 mmol/L (ref 20–29)
Calcium: 9.5 mg/dL (ref 8.7–10.2)
Chloride: 104 mmol/L (ref 96–106)
Creatinine, Ser: 0.56 mg/dL — ABNORMAL LOW (ref 0.57–1.00)
GFR calc Af Amer: 118 mL/min/{1.73_m2} (ref >59)
GFR calc non Af Amer: 102 mL/min/{1.73_m2} (ref >59)
Globulin, Total: 2.2 g/dL (ref 1.5–4.5)
Glucose: 110 mg/dL — ABNORMAL HIGH (ref 65–99)
Potassium: 4 mmol/L (ref 3.5–5.2)
Sodium: 142 mmol/L (ref 134–144)
Total Protein: 6.8 g/dL (ref 6.0–8.5)

## 2020-05-14 LAB — CBC
Hematocrit: 41.8 % (ref 34.0–46.6)
Hemoglobin: 14.2 g/dL (ref 11.1–15.9)
MCH: 28.7 pg (ref 26.6–33.0)
MCHC: 34 g/dL (ref 31.5–35.7)
MCV: 84 fL (ref 79–97)
Platelets: 196 10*3/uL (ref 150–450)
RBC: 4.95 x10E6/uL (ref 3.77–5.28)
RDW: 14.4 % (ref 11.7–15.4)
WBC: 7.4 10*3/uL (ref 3.4–10.8)

## 2020-05-26 ENCOUNTER — Telehealth: Payer: Self-pay | Admitting: Neurology

## 2020-05-26 NOTE — Telephone Encounter (Signed)
open MRI [place] pending faxed notes

## 2020-05-27 NOTE — Telephone Encounter (Signed)
I have the authorization approved for our office [facility] but the patient wants to go to [facility] for the open MRI. The patient has the [place] informed choice she has to give them a call to change the location. I spoke to the patient and informed her of this. I gave her their number of [phone] and gave their tax ID number of [id number]. She states she will give them a call to change the location to [facility].

## 2020-05-27 NOTE — Telephone Encounter (Signed)
open MRI [place]: [id number] (exp. 05/26/20 to 08/24/20) order faxed to [facility]. they will reach out to the patient to schedule.

## 2020-05-27 NOTE — Telephone Encounter (Signed)
schedule for 06/04/20

## 2020-06-08 ENCOUNTER — Telehealth: Payer: Self-pay | Admitting: Neurology

## 2020-06-08 NOTE — Telephone Encounter (Signed)
I reviewed MRI of the cervical spine report from [facility] dated June 04, 2020.  Impression showed mild cervical spondylosis with several small posterior disc osteophyte complexes.  No spinal or foraminal stenosis.  No spinal cord abnormality.  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  For details please see scanned report.  I reviewed MRI of the brain with thin cuts through the internal auditory canals: No definitive acute intracranial abnormality appreciated, multiple small discrete white matter lesions identified, nonspecific usually resulting from the benign remote incidental causes such as chronic ischemic microvascular disease or migraines.  Unremarkable MRI temporal bones.  Norma [name] please call patient and summarized as follows: Patient has arthritic changes in the cervical spine not inconsistent with age, however the spinal cord looks normal and there are no pinched nerves.  MRI of the brain is unremarkable, no strokes or lesions or masses, nothing seen on either scan to account for her imbalance or dizziness.  This is good news, I am glad that there were no significant findings, unfortunately this means that there is really nothing that I can do neurologically to address her symptoms, I did discuss physical therapy with her at last appointment and I think this would probably be the best for her for her imbalance, dizziness and episodes possibly something that combines vestibular therapy with balance exercises.

## 2020-06-08 NOTE — Telephone Encounter (Signed)
Pt had sent a mychart message today requesting results. I have responded to her message with the summarized results below from Dr. Lucia Gaskins. Please see mychart encounter.

## 2020-06-08 NOTE — Telephone Encounter (Signed)
error 

## 2020-07-27 ENCOUNTER — Other Ambulatory Visit: Payer: Self-pay

## 2020-07-27 ENCOUNTER — Ambulatory Visit: Payer: Managed Care, Other (non HMO) | Attending: Neurology | Admitting: Physical Therapy

## 2020-07-27 DIAGNOSIS — R29818 Other symptoms and signs involving the nervous system: Secondary | ICD-10-CM | POA: Insufficient documentation

## 2020-07-27 DIAGNOSIS — R2681 Unsteadiness on feet: Secondary | ICD-10-CM | POA: Diagnosis present

## 2020-07-27 DIAGNOSIS — M6281 Muscle weakness (generalized): Secondary | ICD-10-CM | POA: Diagnosis present

## 2020-07-27 DIAGNOSIS — R2689 Other abnormalities of gait and mobility: Secondary | ICD-10-CM | POA: Insufficient documentation

## 2020-07-27 NOTE — Patient Instructions (Signed)
Access Code: [email or web address] URL: [email or web address] Date: 07/27/2020 Prepared by: Sherlie Ban  Exercises Romberg Stance Eyes Closed on Foam Pad - 1-2 x daily - 7 x weekly - 3 sets - 30 hold Single Leg Stance with Support - 1-2 x daily - 7 x weekly - 3 sets - 15-20 hold

## 2020-07-27 NOTE — Therapy (Signed)
[facility] [address] [place], [place], [address] Phone: [phone]   Fax:  [phone]  Physical Therapy Evaluation  Patient Details  Name: Norma [name] MRN: [id number] Date of Birth: [birth year] Referring Provider (PT): Dr. Lucia Gaskins   Encounter Date: 07/27/2020   PT End of Session - 07/27/20 1202    Visit Number 1    Number of Visits 3   due to financial reasons   Date for PT Re-Evaluation 09/25/20    Authorization Type [employer] - $100 co pay per visit    PT Start Time 0925    PT Stop Time 1015    PT Time Calculation (min) 50 min    Equipment Utilized During Treatment Gait belt    Activity Tolerance Patient tolerated treatment well    Behavior During Therapy WFL for tasks assessed/performed           Past Medical History:  Diagnosis Date   Arthritis    hips   Asthma    Costochondritis    Diabetes (HCC)    GERD (gastroesophageal reflux disease)    Headache    HTN (hypertension)    Hyperlipidemia    Obesity    Palpitations    Paresthesias    on Neurontin   Pinched nerve in neck    pt thinks it's between C3 and C4   PONV (postoperative nausea and vomiting)    PVC (premature ventricular contraction)    Sleep apnea    on CPAP; now moderate.     Past Surgical History:  Procedure Laterality Date   BREAST MASS EXCISION Left    CESAREAN SECTION     CHOLECYSTECTOMY N/A 09/23/2016   Procedure: LAPAROSCOPIC CHOLECYSTECTOMY WITH INTRAOPERATIVE CHOLANGIOGRAM;  Surgeon: Luretha Murphy, MD;  Location: [facility];  Service: General;  Laterality: N/A;   EYE SURGERY     to correct cross sidedness    novasure procedure      RADIOACTIVE SEED GUIDED EXCISIONAL BREAST BIOPSY Left 07/20/2018   Procedure: RADIOACTIVE SEED GUIDED EXCISIONAL BREAST BIOPSY;  Surgeon: Luretha Murphy, MD;  Location: [facility];  Service: General;  Laterality: Left;   TUBAL LIGATION      There were no  vitals filed for this visit.    Subjective Assessment - 07/27/20 0927    Subjective Has been off balance for years - especially when turning. In the beginning of this year started staggering this year when she walks. In the past month, the staggers has been better. Reports these episodes come and goes. Reports still has weakness in R hand and arm. Also reports pain in neck and feels that her head is too heavy. Lately has had no falls. Reports she had one day of spinning - but reports that she thinks that was the medication that she was on. Doesn't drive anymore because she would get lightheaded. Reports that now does not get lightheaded very often.    Pertinent History sleep apnea, diabetes, PVCs and palpitations, paresthesias, obesity, hyperlipidemia, hypertension, headache, asthma, arthritis, costochondritis.    Diagnostic tests per Dr. Trevor Mace review of MRI from June 2021: Patient has arthritic changes in the cervical spine not inconsistent with age, however the spinal cord looks normal and there are no pinched nerves.  MRI of the brain is unremarkable, no strokes or lesions or masses, nothing seen on either scan to account for her imbalance or dizziness.    Patient Stated Goals wants to find out why she is losing her balance  and how to not fall.    Currently in Pain? Yes    Pain Score 4     Pain Location Arm    Pain Orientation Right    Pain Descriptors / Indicators Sore;Aching    Pain Type Chronic pain    Aggravating Factors  filling out forms    Pain Relieving Factors resting              [facility] PT Assessment - 07/27/20 0937      Assessment   Medical Diagnosis imbalance/gait abnormality    Referring Provider (PT) Dr. Lucia Gaskins    Onset Date/Surgical Date 05/13/20   date of referral, sx started in 2014   Hand Dominance Right    Prior Therapy received PT in the past prior for R arm weakness      Precautions   Precautions Fall      Balance Screen   Has the patient fallen in the past  6 months No    Has the patient had a decrease in activity level because of a fear of falling?  Yes    Is the patient reluctant to leave their home because of a fear of falling?  No      Home Environment   Living Environment Private residence    Living Arrangements Spouse/significant other    Type of Home House    Home Access Stairs to enter    Entrance Stairs-Number of Steps 4    Entrance Stairs-Rails None    Home Layout One level    Home Equipment None      Prior Function   Level of Independence Independent   husband helps with driving   Leisure wants to be able to get around and not fall and get hurt      Sensation   Light Touch Appears Intact    Additional Comments reports sometimes has numbness/tingling in feet      Coordination   Gross Motor Movements are Fluid and Coordinated Yes      Posture/Postural Control   Posture/Postural Control Postural limitations    Postural Limitations Forward head;Rounded Shoulders      ROM / Strength   AROM / PROM / Strength Strength      Strength   Strength Assessment Site Hip;Knee;Ankle    Right/Left Hip Right;Left    Right Hip Flexion 4/5    Left Hip Flexion 4+/5    Right/Left Knee Right;Left    Right Knee Flexion 5/5    Right Knee Extension 5/5    Left Knee Flexion 4+/5    Left Knee Extension 5/5    Right/Left Ankle Right;Left    Right Ankle Dorsiflexion 5/5    Left Ankle Dorsiflexion 5/5      Transfers   Transfers Sit to Stand;Stand Pivot Transfers;Stand to Sit;Squat Pivot Transfers    Sit to Stand 5: Supervision;Without upper extremity assist    Five time sit to stand comments  13.31 seconds, reports pain  in the back of the legs after performing    Stand to Sit 5: Supervision;Without upper extremity assist      Ambulation/Gait   Ambulation/Gait Yes    Ambulation/Gait Assistance 5: Supervision    Assistive device None    Gait Pattern Step-through pattern;Wide base of support    Ambulation Surface Level;Indoor    Gait  velocity 9.53 seconds = 3.44 ft/sec      High Level Balance   High Level Balance Comments SLS: LLE - 13.71 seconds, RLE - 4.13  seconds, mCTSIB performed with feet together, all 4 conditions: 30 seconds - however incr postural sway with condition 4      Functional Gait  Assessment   Gait assessed  Yes    Gait Level Surface Walks 20 ft in less than 7 sec but greater than 5.5 sec, uses assistive device, slower speed, mild gait deviations, or deviates 6-10 in outside of the 12 in walkway width.   5.75   Change in Gait Speed Able to smoothly change walking speed without loss of balance or gait deviation. Deviate no more than 6 in outside of the 12 in walkway width.    Gait with Horizontal Head Turns Performs head turns smoothly with slight change in gait velocity (eg, minor disruption to smooth gait path), deviates 6-10 in outside 12 in walkway width, or uses an assistive device.    Gait with Vertical Head Turns Performs task with slight change in gait velocity (eg, minor disruption to smooth gait path), deviates 6 - 10 in outside 12 in walkway width or uses assistive device    Gait and Pivot Turn Pivot turns safely within 3 sec and stops quickly with no loss of balance.    Step Over Obstacle Is able to step over 2 stacked shoe boxes taped together (9 in total height) without changing gait speed. No evidence of imbalance.    Gait with Narrow Base of Support Is able to ambulate for 10 steps heel to toe with no staggering.    Gait with Eyes Closed Walks 20 ft, slow speed, abnormal gait pattern, evidence for imbalance, deviates 10-15 in outside 12 in walkway width. Requires more than 9 sec to ambulate 20 ft.   9.19 seconds   Ambulating Backwards Walks 20 ft, no assistive devices, good speed, no evidence for imbalance, normal gait    Steps Alternating feet, no rail.    Total Score 25    FGA comment: 25/30                  Vestibular Assessment - 07/27/20 0958      Symptom Behavior    Subjective history of current problem see subjective above. reports mild nonpulsatile tinnitus in both ears.    Type of Dizziness  Imbalance;Lightheadedness      Oculomotor Exam   Smooth Pursuits Intact    Saccades Hypometric   2 beats each direction   Comment reports blurriness in L eye, reports both eyes cross       Oculomotor Exam-Fixation Suppressed    Left Head Impulse negative    Right Head Impulse negative      Vestibulo-Ocular Reflex   VOR to Slow Head Movement --   reports mild dizziness   VOR Cancellation Normal      Positional Sensitivities   Head Turning x 5 No dizziness   eyes beating when turning head   Head Nodding x 5 Lightheadedness              Objective measurements completed on examination: See above findings.          Access Code: [email or web address] URL: [email or web address] Date: 07/27/2020 Prepared by: Sherlie Ban  Initiated HEP:   Exercises Romberg Stance Eyes Closed on Foam Pad - 1-2 x daily - 7 x weekly - 3 sets - 30 hold Single Leg Stance with Support - 1-2 x daily - 7 x weekly - 3 sets - 15-20 hold      PT Education - 07/27/20 1017    Education Details  clinical findings, POC, initial HEP - getting scheduled for 1 more visit due to pt's copay    Person(s) Educated Patient    Methods Explanation;Demonstration;Handout;Verbal cues    Comprehension Verbalized understanding            PT Short Term Goals - 07/27/20 1206      PT SHORT TERM GOAL #1   Title ALL STGS = LTGS             PT Long Term Goals - 07/27/20 1206      PT LONG TERM GOAL #1   Title Pt will be independent with initial HEP for balance and BLE strength. ALL LTGS DUE 09/07/20    Time 6    Period Weeks    Status New    Target Date 09/07/20      PT LONG TERM GOAL #2   Title Pt will improve FGA score to at least a 27/30 in order to demo decr fall risk.    Baseline 25/30    Time 6    Period Weeks    Status New                  Plan -  07/27/20 1204    Clinical Impression Statement Patient is a 59 year old female referred to Neuro OPPT for balance impairments and gait abnormalities. Pt's PMH is significant for: sleep apnea, diabetes, PVCs and palpitations, paresthesias, obesity, hyperlipidemia, hypertension, headache, asthma, arthritis, costochondritis. The following deficits were present during the exam: balance abnormalities, decr vestibular input for balance, impaired SLS, reports of lightheadedness during head motions during gait, decr BLE strength, visual impairments.  Based on FGA score pt is at a low risk for falls. Pts gait speed indicates that pt is a Tourist information centre managercommunity ambulator. Due to pts co-pay of $100, pts visits are limited with PT. Focused on getting pt started on initial HEP with focus on vestibular input for balance and SLS. Will see pt for 1-2 more visits to review HEP and add on as appropriate. Pt would benefit from skilled PT to address these impairments and functional limitations to maximize functional mobility independence    Personal Factors and Comorbidities Comorbidity 3+;Time since onset of injury/illness/exacerbation;Past/Current Experience;Finances   pt with $100 co pay   Comorbidities past medical history of sleep apnea, diabetes, PVCs and palpitations, paresthesias, obesity, hyperlipidemia, hypertension, headache, asthma, arthritis, costochondritis.    Examination-Activity Limitations Stairs;Locomotion Level    Examination-Participation Restrictions Community Activity;Driving;Yard Work    Conservation officer, historic buildingstability/Clinical Decision Making Evolving/Moderate complexity    Clinical Decision Making Moderate    Rehab Potential Good    PT Frequency 1x / week    PT Duration --   every 3 weeks - due to financial reasons and pt with $100 co pay   PT Treatment/Interventions ADLs/Self Care Home Management;Gait training;Stair training;Functional mobility training;Therapeutic activities;Therapeutic exercise;Patient/family  education;Neuromuscular re-education;Balance training;Vestibular    PT Next Visit Plan how was initial HEP? add to HEP with focus on balance on compliant surfaces, head turns/nods, eyes closed    Consulted and Agree with Plan of Care Patient           Patient will benefit from skilled therapeutic intervention in order to improve the following deficits and impairments:  Abnormal gait, Dizziness, Decreased activity tolerance, Decreased balance, Difficulty walking, Decreased strength, Postural dysfunction, Pain, Impaired vision/preception  Visit Diagnosis: Unsteadiness on feet  Other abnormalities of gait and mobility  Muscle weakness (generalized)  Other symptoms and signs involving the nervous system  Problem List Patient Active Problem List   Diagnosis Date Noted   Imbalance 05/13/2020   PVC (premature ventricular contraction) 12/07/2011   URI (upper respiratory infection) 12/07/2011   Mixed hyperlipidemia 12/07/2011   Dizzy 12/07/2011    Drake Leach, PT, DPT  07/27/2020, 12:13 PM  [facility] [address] [place], [place], [address] Phone: [phone]   Fax:  [phone]  Name: Norma [name] MRN: [id number] Date of Birth: 10/23/1961

## 2020-08-17 ENCOUNTER — Other Ambulatory Visit: Payer: Self-pay

## 2020-08-17 ENCOUNTER — Ambulatory Visit: Payer: Managed Care, Other (non HMO) | Admitting: Physical Therapy

## 2020-08-17 DIAGNOSIS — R29818 Other symptoms and signs involving the nervous system: Secondary | ICD-10-CM

## 2020-08-17 DIAGNOSIS — R2681 Unsteadiness on feet: Secondary | ICD-10-CM | POA: Diagnosis not present

## 2020-08-17 DIAGNOSIS — M6281 Muscle weakness (generalized): Secondary | ICD-10-CM

## 2020-08-17 DIAGNOSIS — R2689 Other abnormalities of gait and mobility: Secondary | ICD-10-CM

## 2020-08-17 NOTE — Patient Instructions (Signed)
Access Code: [email or web address] URL: [email or web address] Date: 08/17/2020 Prepared by: Sherlie Ban  Exercises Romberg Stance Eyes Closed on Foam Pad - 1-2 x daily - 7 x weekly - 2 sets - 10 reps Single Leg Stance on Foam Pad - 1-2 x daily - 7 x weekly - 3 sets - 10-15 hold Walking with Head Rotation - 1-2 x daily - 7 x weekly - 4 sets Walking Tandem Stance - 1-2 x daily - 7 x weekly - 3 sets - 10 reps Standing Romberg to 3/4 Tandem Stance - 1-2 x daily - 7 x weekly - 3 sets - 15 hold

## 2020-08-17 NOTE — Therapy (Signed)
[facility] [address] [place], [place], [address] Phone: [phone]   Fax:  [phone]  Physical Therapy Treatment  Patient Details  Name: Norma [name] MRN: [id number] Date of Birth: 22-Oct-1961 Referring Provider (PT): Dr. Lucia Gaskins   Encounter Date: 08/17/2020   PT End of Session - 08/17/20 1147    Visit Number 2    Number of Visits 3   due to financial reasons   Date for PT Re-Evaluation 09/25/20    Authorization Type [employer] - $100 co pay per visit    PT Start Time 0801    PT Stop Time 0844    PT Time Calculation (min) 43 min    Equipment Utilized During Treatment Gait belt    Activity Tolerance Patient tolerated treatment well    Behavior During Therapy [facility] for tasks assessed/performed           Past Medical History:  Diagnosis Date  . Arthritis    hips  . Asthma   . Costochondritis   . Diabetes (HCC)   . GERD (gastroesophageal reflux disease)   . Headache   . HTN (hypertension)   . Hyperlipidemia   . Obesity   . Palpitations   . Paresthesias    on Neurontin  . Pinched nerve in neck    pt thinks it's between C3 and C4  . PONV (postoperative nausea and vomiting)   . PVC (premature ventricular contraction)   . Sleep apnea    on CPAP; now moderate.     Past Surgical History:  Procedure Laterality Date  . BREAST MASS EXCISION Left   . CESAREAN SECTION    . CHOLECYSTECTOMY N/A 09/23/2016   Procedure: LAPAROSCOPIC CHOLECYSTECTOMY WITH INTRAOPERATIVE CHOLANGIOGRAM;  Surgeon: Luretha Murphy, MD;  Location: [facility];  Service: General;  Laterality: N/A;  . EYE SURGERY     to correct cross sidedness   . novasure procedure     . RADIOACTIVE SEED GUIDED EXCISIONAL BREAST BIOPSY Left 07/20/2018   Procedure: RADIOACTIVE SEED GUIDED EXCISIONAL BREAST BIOPSY;  Surgeon: Luretha Murphy, MD;  Location: [facility];  Service: General;  Laterality: Left;  . TUBAL LIGATION      There were no  vitals filed for this visit.   Subjective Assessment - 08/17/20 0802    Subjective No falls - almost had one fall going in the stairs and carrying groceries and going up the steps. Has been doing the exercises, they are more challenging to do later on in the day. No lightheaded/dizziness - just feels unsteady.    Pertinent History sleep apnea, diabetes, PVCs and palpitations, paresthesias, obesity, hyperlipidemia, hypertension, headache, asthma, arthritis, costochondritis.    Diagnostic tests per Dr. Trevor Mace review of MRI from June 2021: Patient has arthritic changes in the cervical spine not inconsistent with age, however the spinal cord looks normal and there are no pinched nerves.  MRI of the brain is unremarkable, no strokes or lesions or masses, nothing seen on either scan to account for her imbalance or dizziness.    Patient Stated Goals wants to find out why she is losing her balance and how to not fall.    Currently in Pain? No/denies   "just feeling a little sore - no pain"                Access Code: [email or web address] URL: [email or web address] Date: 08/17/2020 Prepared by: Sherlie Ban  Upgraded and added to pt's HEP. Discussed ways of progressing at home when exercises  feel too easy - such as increasing the thickness of the surface she is standing on, incr the time, and adding tandem gait with head motions at the counter.   Exercises Romberg Stance Eyes Closed on Foam Pad - 1-2 x daily - 7 x weekly - 2 sets - 10 reps - with x10 reps head nods, x10 reps head turns  Single Leg Stance on Foam Pad - 1-2 x daily - 7 x weekly - 3 sets - 10-15 hold Walking with Head Rotation - 1-2 x daily - 7 x weekly - 4 sets - next to countertop Walking Tandem Stance - 1-2 x daily - 7 x weekly - 3 sets - 10 reps -forwards and backwards  Standing Romberg to 3/4 Tandem Stance - 1-2 x daily - 7 x weekly - 3 sets - 15 hold - on 2 pillows with eyes closed               [facility] Adult  PT Treatment/Exercise - 08/17/20 0001      Neuro Re-ed    Neuro Re-ed Details  Next to countertop: tandem gait head turns with fingertip support - down and back x4 reps, cues for slowed and controlled. In corner: On rockerboard A/P direction weight shifting with focus on hip/ankle strategy x15 reps with intermittent UE support, and then closing eyes multiple reps of 10-15 seconds.                  PT Education - 08/17/20 1147    Education Details upgrades to [employer]) Educated Patient    Methods Explanation;Demonstration;Handout    Comprehension Verbalized understanding;Returned demonstration            PT Short Term Goals - 07/27/20 1206      PT SHORT TERM GOAL #1   Title ALL STGS = LTGS             PT Long Term Goals - 07/27/20 1206      PT LONG TERM GOAL #1   Title Pt will be independent with initial HEP for balance and BLE strength. ALL LTGS DUE 09/07/20    Time 6    Period Weeks    Status New    Target Date 09/07/20      PT LONG TERM GOAL #2   Title Pt will improve FGA score to at least a 27/30 in order to demo decr fall risk.    Baseline 25/30    Time 6    Period Weeks    Status New                 Plan - 08/17/20 1148    Clinical Impression Statement Focus of today's skilled session was upgrading pt's HEP with focus on narrow BOS, vestibular system, head turns, and balance on compliant surfaces. Remainder of session focused more on higher level balance with addressing vestibular system for balance, pt challenge with activities on rockerboard with eyes cloesd. Pt with $100 co-pay and will call back and let us know how exercises are going at home and to determine whether or not she will come in for one more visit.    Personal Factors and Comorbidities Comorbidity 3+;Time since onset of injury/illness/exacerbation;Past/Current Experience;Finances   pt with $100 co pay   Comorbidities past medical history of sleep apnea, diabetes, PVCs and  palpitations, paresthesias, obesity, hyperlipidemia, hypertension, headache, asthma, arthritis, costochondritis.    Examination-Activity Limitations Stairs;Locomotion Level    Examination-Participation Restrictions Community Activity;Driving;[employer]  Work    Conservation officer, historic buildings Evolving/Moderate complexity    Rehab Potential Good    PT Frequency 1x / week    PT Duration --   every 3 weeks - due to financial reasons and pt with $100 co pay   PT Treatment/Interventions ADLs/Self Care Home Management;Gait training;Stair training;Functional mobility training;Therapeutic activities;Therapeutic exercise;Patient/family education;Neuromuscular re-education;Balance training;Vestibular    PT Next Visit Plan how is HEP going? add to HEP with focus on balance on compliant surfaces, head turns/nods, eyes closed    Consulted and Agree with Plan of Care Patient           Patient will benefit from skilled therapeutic intervention in order to improve the following deficits and impairments:  Abnormal gait, Dizziness, Decreased activity tolerance, Decreased balance, Difficulty walking, Decreased strength, Postural dysfunction, Pain, Impaired vision/preception  Visit Diagnosis: Unsteadiness on feet  Other abnormalities of gait and mobility  Muscle weakness (generalized)  Other symptoms and signs involving the nervous system     Problem List Patient Active Problem List   Diagnosis Date Noted  . Imbalance 05/13/2020  . PVC (premature ventricular contraction) 12/07/2011  . URI (upper respiratory infection) 12/07/2011  . Mixed hyperlipidemia 12/07/2011  . Dizzy 12/07/2011    Drake Leach, PT, DPT  08/17/2020, 11:53 AM  [facility] [address] [place], [place], [address] Phone: [phone]   Fax:  [phone]  Name: LATRESHIA BEAUCHAINE MRN: [id number] Date of Birth: 1961/07/10

## 2021-01-01 ENCOUNTER — Encounter: Payer: Self-pay | Admitting: Physical Therapy

## 2021-01-01 NOTE — Therapy (Signed)
[facility] [address] [place], [place], [address] Phone: [phone]   Fax:  [phone]  Patient Details  Name: Norma [name] MRN: [id number] Date of Birth: [birth year] Referring Provider:  No ref. provider found  Encounter Date: 01/01/2021  PHYSICAL THERAPY DISCHARGE SUMMARY  Visits from Start of Care: 2  Pt did not return for additional PT visits due to $100 co-pay.  Plan: Patient agrees to discharge.  Patient goals were not met. Patient is being discharged due to financial reasons.  ?????       Arliss Journey, PT, DPT  01/01/2021, 2:16 PM  [facility] [address] [place], [place], [address] Phone: [phone]   Fax:  [phone]

## 2021-01-14 ENCOUNTER — Emergency Department (HOSPITAL_COMMUNITY): Payer: Managed Care, Other (non HMO)

## 2021-01-14 ENCOUNTER — Other Ambulatory Visit: Payer: Self-pay

## 2021-01-14 ENCOUNTER — Inpatient Hospital Stay (HOSPITAL_COMMUNITY)
Admission: EM | Admit: 2021-01-14 | Discharge: 2021-01-16 | DRG: 177 | Disposition: A | Discharge status: Home / Self Care | Payer: Managed Care, Other (non HMO) | Attending: Family Medicine | Admitting: Family Medicine

## 2021-01-14 DIAGNOSIS — Z885 Allergy status to narcotic agent status: Secondary | ICD-10-CM

## 2021-01-14 DIAGNOSIS — J1282 Pneumonia due to coronavirus disease 2019: Secondary | ICD-10-CM | POA: Diagnosis present

## 2021-01-14 DIAGNOSIS — I10 Essential (primary) hypertension: Secondary | ICD-10-CM | POA: Diagnosis present

## 2021-01-14 DIAGNOSIS — Z8249 Family history of ischemic heart disease and other diseases of the circulatory system: Secondary | ICD-10-CM

## 2021-01-14 DIAGNOSIS — K219 Gastro-esophageal reflux disease without esophagitis: Secondary | ICD-10-CM | POA: Diagnosis present

## 2021-01-14 DIAGNOSIS — J9601 Acute respiratory failure with hypoxia: Secondary | ICD-10-CM | POA: Diagnosis present

## 2021-01-14 DIAGNOSIS — R0602 Shortness of breath: Secondary | ICD-10-CM | POA: Diagnosis not present

## 2021-01-14 DIAGNOSIS — Z888 Allergy status to other drugs, medicaments and biological substances status: Secondary | ICD-10-CM

## 2021-01-14 DIAGNOSIS — E1141 Type 2 diabetes mellitus with diabetic mononeuropathy: Secondary | ICD-10-CM | POA: Diagnosis present

## 2021-01-14 DIAGNOSIS — U071 COVID-19: Principal | ICD-10-CM | POA: Diagnosis present

## 2021-01-14 DIAGNOSIS — M160 Bilateral primary osteoarthritis of hip: Secondary | ICD-10-CM | POA: Diagnosis present

## 2021-01-14 DIAGNOSIS — Z881 Allergy status to other antibiotic agents status: Secondary | ICD-10-CM

## 2021-01-14 DIAGNOSIS — E669 Obesity, unspecified: Secondary | ICD-10-CM | POA: Diagnosis present

## 2021-01-14 DIAGNOSIS — E785 Hyperlipidemia, unspecified: Secondary | ICD-10-CM | POA: Diagnosis present

## 2021-01-14 DIAGNOSIS — J45909 Unspecified asthma, uncomplicated: Secondary | ICD-10-CM | POA: Diagnosis present

## 2021-01-14 DIAGNOSIS — Z79899 Other long term (current) drug therapy: Secondary | ICD-10-CM

## 2021-01-14 DIAGNOSIS — E119 Type 2 diabetes mellitus without complications: Secondary | ICD-10-CM

## 2021-01-14 DIAGNOSIS — D709 Neutropenia, unspecified: Secondary | ICD-10-CM | POA: Diagnosis present

## 2021-01-14 DIAGNOSIS — Z6836 Body mass index (BMI) 36.0-36.9, adult: Secondary | ICD-10-CM

## 2021-01-14 DIAGNOSIS — Z7982 Long term (current) use of aspirin: Secondary | ICD-10-CM

## 2021-01-14 DIAGNOSIS — F39 Unspecified mood [affective] disorder: Secondary | ICD-10-CM | POA: Diagnosis present

## 2021-01-14 DIAGNOSIS — Z7984 Long term (current) use of oral hypoglycemic drugs: Secondary | ICD-10-CM

## 2021-01-14 DIAGNOSIS — G4730 Sleep apnea, unspecified: Secondary | ICD-10-CM | POA: Diagnosis present

## 2021-01-14 LAB — CBG MONITORING, ED: Glucose-Capillary: 106 mg/dL — ABNORMAL HIGH (ref 70–99)

## 2021-01-14 MED ORDER — ALBUTEROL SULFATE HFA 108 (90 BASE) MCG/ACT IN AERS
4.0000 | INHALATION_SPRAY | Freq: Once | RESPIRATORY_TRACT | Status: AC
  Administered 2021-01-14: 4 via RESPIRATORY_TRACT
  Filled 2021-01-14: qty 6.7

## 2021-01-14 MED ORDER — SODIUM CHLORIDE 0.9 % IV BOLUS
500.0000 mL | Freq: Once | INTRAVENOUS | Status: AC
  Administered 2021-01-14: 500 mL via INTRAVENOUS

## 2021-01-14 MED ORDER — ACETAMINOPHEN 325 MG PO TABS
650.0000 mg | ORAL_TABLET | Freq: Once | ORAL | Status: AC
  Administered 2021-01-14: 650 mg via ORAL
  Filled 2021-01-14: qty 2

## 2021-01-14 MED ORDER — SODIUM CHLORIDE 0.9 % IV BOLUS
500.0000 mL | Freq: Once | INTRAVENOUS | Status: AC
  Administered 2021-01-15: 500 mL via INTRAVENOUS

## 2021-01-14 NOTE — ED Provider Notes (Signed)
[facility]-EMERGENCY DEPT Provider Note   CSN: [id number] Arrival date & time: 01/14/21  1837     History Chief Complaint  Patient presents with  . Shortness of Breath  . Covid Positive    Norma [name] is a 60 y.o. female past medical history of asthma, type 2 diabetes, hypertension, sleep apnea, presenting to the emergency department with complaint of worsening shortness of breath of the last 3 to 4 days in the setting of COVID-19 illness.  She states her symptoms started on 01/03/2021, she tested positive examination at [facility] on 01/05/2021.  She states she has been having intermittent fevers at home.  Sometimes she has posttussive emesis.  Has not had COVID vaccines.  Denies any new chest pain, abdominal pain, urinary symptoms.  The history is provided by the patient.       Past Medical History:  Diagnosis Date  . Arthritis    hips  . Asthma   . Costochondritis   . Diabetes (HCC)   . GERD (gastroesophageal reflux disease)   . Headache   . HTN (hypertension)   . Hyperlipidemia   . Obesity   . Palpitations   . Paresthesias    on Neurontin  . Pinched nerve in neck    pt thinks it's between C3 and C4  . PONV (postoperative nausea and vomiting)   . PVC (premature ventricular contraction)   . Sleep apnea    on CPAP; now moderate.     Patient Active Problem List   Diagnosis Date Noted  . Imbalance 05/13/2020  . PVC (premature ventricular contraction) 12/07/2011  . URI (upper respiratory infection) 12/07/2011  . Mixed hyperlipidemia 12/07/2011  . Dizzy 12/07/2011    Past Surgical History:  Procedure Laterality Date  . BREAST MASS EXCISION Left   . CESAREAN SECTION    . CHOLECYSTECTOMY N/A 09/23/2016   Procedure: LAPAROSCOPIC CHOLECYSTECTOMY WITH INTRAOPERATIVE CHOLANGIOGRAM;  Surgeon: Luretha Murphy, MD;  Location: [facility];  Service: General;  Laterality: N/A;  . EYE SURGERY     to correct cross sidedness   .  novasure procedure     . RADIOACTIVE SEED GUIDED EXCISIONAL BREAST BIOPSY Left 07/20/2018   Procedure: RADIOACTIVE SEED GUIDED EXCISIONAL BREAST BIOPSY;  Surgeon: Luretha Murphy, MD;  Location:  SURGERY CENTER;  Service: General;  Laterality: Left;  . TUBAL LIGATION       OB History   No obstetric history on file.     Family History  Problem Relation Age of Onset  . Other Mother        benign brain tumor   . Stroke Father   . Heart attack Father   . Multiple sclerosis Niece     Social History   Tobacco Use  . Smoking status: Never Smoker  . Smokeless tobacco: Never Used  Vaping Use  . Vaping Use: Never used  Substance Use Topics  . Alcohol use: No  . Drug use: No    Home Medications Prior to Admission medications   Medication Sig Start Date End Date Taking? Authorizing Provider  albuterol (PROVENTIL HFA;VENTOLIN HFA) 108 (90 Base) MCG/ACT inhaler Inhale 2 puffs into the lungs every 6 (six) hours as needed for wheezing or shortness of breath.    [provider]  aspirin 81 MG tablet Take 81 mg by mouth daily.      [provider]  CINNAMON PO Take 1,000 mg by mouth daily.    [provider]  cyclobenzaprine (FLEXERIL) 5 MG tablet Take 5 mg by mouth at bedtime as needed for muscle spasms.    [provider]  escitalopram (LEXAPRO) 5 MG tablet Take 10 mg by mouth daily.  03/10/20   [provider]  gabapentin (NEURONTIN) 600 MG tablet Take 600 mg by mouth at bedtime.     [provider]  HYDROcodone-acetaminophen (NORCO/VICODIN) 5-325 MG tablet Take 1 tablet by mouth every 6 (six) hours as needed for moderate pain. 07/20/18   Luretha MurphyMartin, Matthew, MD  ibuprofen (ADVIL,MOTRIN) 800 MG tablet Take 800 mg by mouth every 8 (eight) hours as needed for pain. 07/25/16   [provider]  Lancets (ONETOUCH DELICA PLUS [place]LANCET33G) MISC Apply 1 each topically daily. 03/10/20   [provider]  lisinopril  (PRINIVIL,ZESTRIL) 40 MG tablet Take 40 mg by mouth daily.    [provider]  metFORMIN (GLUCOPHAGE-XR) 500 MG 24 hr tablet Take 500 mg by mouth 2 (two) times daily. 03/10/20   [provider]  metoprolol succinate (TOPROL-XL) 50 MG 24 hr tablet Take 50 mg by mouth every evening.  08/08/16   [provider]  omeprazole (PRILOSEC) 20 MG capsule Take 20 mg by mouth daily. 03/31/20   [provider]  [facility] test strip 1 each daily. 03/10/20   [provider]  rosuvastatin (CRESTOR) 5 MG tablet Take 5 mg by mouth once a week.  04/07/20   [provider]  RYBELSUS 3 MG TABS Take 1 tablet by mouth every morning. 03/11/20   [provider]  VITAMIN D PO Take 5,000 Int'l Units by mouth daily.    [provider]    Allergies    Beconase aq [beclomethasone diprop monohyd], Atenolol, Other, Cephalexin, and Codeine  Review of Systems   Review of Systems  Constitutional: Positive for fever.  Respiratory: Positive for cough and shortness of breath.   All other systems reviewed and are negative.   Physical Exam Updated Vital Signs BP (!) 88/51 Comment: PA notified  Pulse 95   Temp 100.3 F (37.9 C) (Oral)   Resp (!) 33   LMP  (LMP Unknown)   SpO2 93%   Physical Exam Vitals and nursing note reviewed.  Constitutional:      Appearance: She is well-developed and well-nourished. She is ill-appearing.  HENT:     Head: Normocephalic and atraumatic.  Eyes:     Conjunctiva/sclera: Conjunctivae normal.  Cardiovascular:     Rate and Rhythm: Normal rate and regular rhythm.  Pulmonary:     Effort: Pulmonary effort is normal. Tachypnea present.     Comments: Fine rales in the bases. Patient appears winded with speaking, O2 sat is 92% on RA during evaluation Abdominal:     General: Bowel sounds are normal.     Palpations: Abdomen is soft.     Tenderness: There is no abdominal tenderness.  Musculoskeletal:     Right lower leg:  No edema.     Left lower leg: No edema.  Skin:    General: Skin is warm.  Neurological:     Mental Status: She is alert.  Psychiatric:        Mood and Affect: Mood and affect normal.        Behavior: Behavior normal.       ED Results / Procedures / Treatments   Labs (all labs ordered are listed, but only abnormal results are displayed) Labs Reviewed  CBG MONITORING, ED - Abnormal; Notable for the following components:  Result Value   Glucose-Capillary 106 (*)    All other components within normal limits  CULTURE, BLOOD (ROUTINE X 2)  CULTURE, BLOOD (ROUTINE X 2)  COMPREHENSIVE METABOLIC PANEL  CBC WITH DIFFERENTIAL/PLATELET  LACTIC ACID, PLASMA  LACTIC ACID, PLASMA  D-DIMER, QUANTITATIVE (NOT AT [facility])  PROCALCITONIN  LACTATE DEHYDROGENASE  FERRITIN  TRIGLYCERIDES  FIBRINOGEN  C-REACTIVE PROTEIN  TROPONIN I (HIGH SENSITIVITY)  TROPONIN I (HIGH SENSITIVITY)    EKG None  Radiology DG Chest Port 1 View  Result Date: 01/14/2021 CLINICAL DATA:  COVID-19 positivity with shortness of breath EXAM: PORTABLE CHEST 1 VIEW COMPARISON:  08/27/2010 FINDINGS: The heart size and mediastinal contours are within normal limits. Both lungs are clear. The visualized skeletal structures are unremarkable. IMPRESSION: No active disease. Electronically Signed   By: Alcide Clever M.D.   On: 01/14/2021 20:12    Procedures .Critical Care Performed by: Alease Fait, [place] N, PA-C Authorized by: Lemoyne Scarpati, [place] N, PA-C   Critical care provider statement:    Critical care time (minutes):  45   Critical care time was exclusive of:  Separately billable procedures and treating other patients and teaching time   Critical care was necessary to treat or prevent imminent or life-threatening deterioration of the following conditions:  Respiratory failure   Critical care was time spent personally by me on the following activities:  Discussions with consultants, evaluation of patient's response to  treatment, examination of patient, ordering and performing treatments and interventions, ordering and review of laboratory studies, ordering and review of radiographic studies, pulse oximetry, re-evaluation of patient's condition, obtaining history from patient or surrogate and review of old charts   I assumed direction of critical care for this patient from another provider in my specialty: no     (including critical care time)  Medications Ordered in ED Medications  sodium chloride 0.9 % bolus 500 mL (has no administration in time range)  acetaminophen (TYLENOL) tablet 650 mg (650 mg Oral Given 01/14/21 2056)  albuterol (VENTOLIN HFA) 108 (90 Base) MCG/ACT inhaler 4 puff (4 puffs Inhalation Given 01/14/21 2235)  sodium chloride 0.9 % bolus 500 mL (500 mLs Intravenous New Bag/Given 01/14/21 2339)    ED Course  I have reviewed the triage vital signs and the nursing notes.  Pertinent labs & imaging results that were available during my care of the patient were reviewed by me and considered in my medical decision making (see chart for details).  Clinical Course as of 01/14/21 2353  Thu Jan 14, 2021  2256 Patient has become hypoxic to 87% on room air.  She is placed on 2 L nasal cannula with improvement to 92%.  She remains tachypneic and appears short of breath.  Will obtain additional blood work and admit for O2 supplementation of the setting of COVID-19. [JR]    Clinical Course User Index [JR] Dijon Cosens, [place] N, PA-C   MDM Rules/Calculators/A&P                          Patient presenting for worsening shortness of breath over the last few days in the setting of COVID-19 illness.  Symptoms began on 01/03/2021 with positive result on 01/05/2021.  Temperature is 100.3 F on arrival with mild tachycardia.  On examination she appears winded with speaking.  Initially O2 saturation is 92% on room air and patient ambulated maintaining 91% on room air with dyspnea, however patient became hypoxic to 87%  and with increased tachypnea requiring 2  L nasal cannula supplemental oxygen.  Blood pressure also downtrending.  She is given fluid resuscitation.  Chest x-ray without infiltrate noted.  Blood work is pending.  Patient will require admission for acute respiratory failure in the setting of COVID-19 illness. Care assumed at shift change by Lonell Grandchild, to re-evaluate for BP improvement after fluids, follow blood work and admit.  Norma [name] was evaluated in Emergency Department on 01/14/2021 for the symptoms described in the history of present illness. She was evaluated in the context of the global COVID-19 pandemic, which necessitated consideration that the patient might be at risk for infection with the SARS-CoV-2 virus that causes COVID-19. Institutional protocols and algorithms that pertain to the evaluation of patients at risk for COVID-19 are in a state of rapid change based on information released by regulatory bodies including the CDC and federal and state organizations. These policies and algorithms were followed during the patient's care in the ED.  Final Clinical Impression(s) / ED Diagnoses Final diagnoses:  Acute hypoxemic respiratory failure due to COVID-19 [facility])    Rx / DC Orders ED Discharge Orders    None       Madhavi Hamblen, [place] N, PA-C 01/14/21 2353    Milagros Loll, MD 01/15/21 [phone]

## 2021-01-14 NOTE — ED Triage Notes (Signed)
Pt arrived via walk in, states COVID positive last Tuesday (1/11), worsening Sob with ambulation and cough since.

## 2021-01-14 NOTE — ED Notes (Signed)
Patient ambulated around the room. She reported feeling short of breath while walking. O2 sats remained between 91- 92% while walking.

## 2021-01-15 ENCOUNTER — Inpatient Hospital Stay (HOSPITAL_COMMUNITY): Payer: Managed Care, Other (non HMO)

## 2021-01-15 ENCOUNTER — Encounter (HOSPITAL_COMMUNITY): Payer: Self-pay | Admitting: Internal Medicine

## 2021-01-15 DIAGNOSIS — F39 Unspecified mood [affective] disorder: Secondary | ICD-10-CM | POA: Diagnosis present

## 2021-01-15 DIAGNOSIS — Z881 Allergy status to other antibiotic agents status: Secondary | ICD-10-CM | POA: Diagnosis not present

## 2021-01-15 DIAGNOSIS — J45909 Unspecified asthma, uncomplicated: Secondary | ICD-10-CM | POA: Diagnosis present

## 2021-01-15 DIAGNOSIS — D709 Neutropenia, unspecified: Secondary | ICD-10-CM | POA: Diagnosis present

## 2021-01-15 DIAGNOSIS — Z7984 Long term (current) use of oral hypoglycemic drugs: Secondary | ICD-10-CM | POA: Diagnosis not present

## 2021-01-15 DIAGNOSIS — J9601 Acute respiratory failure with hypoxia: Secondary | ICD-10-CM

## 2021-01-15 DIAGNOSIS — E669 Obesity, unspecified: Secondary | ICD-10-CM | POA: Diagnosis present

## 2021-01-15 DIAGNOSIS — E1141 Type 2 diabetes mellitus with diabetic mononeuropathy: Secondary | ICD-10-CM | POA: Diagnosis present

## 2021-01-15 DIAGNOSIS — Z79899 Other long term (current) drug therapy: Secondary | ICD-10-CM | POA: Diagnosis not present

## 2021-01-15 DIAGNOSIS — E119 Type 2 diabetes mellitus without complications: Secondary | ICD-10-CM

## 2021-01-15 DIAGNOSIS — E785 Hyperlipidemia, unspecified: Secondary | ICD-10-CM | POA: Diagnosis present

## 2021-01-15 DIAGNOSIS — U071 COVID-19: Principal | ICD-10-CM

## 2021-01-15 DIAGNOSIS — G4730 Sleep apnea, unspecified: Secondary | ICD-10-CM | POA: Diagnosis present

## 2021-01-15 DIAGNOSIS — Z885 Allergy status to narcotic agent status: Secondary | ICD-10-CM | POA: Diagnosis not present

## 2021-01-15 DIAGNOSIS — Z6836 Body mass index (BMI) 36.0-36.9, adult: Secondary | ICD-10-CM | POA: Diagnosis not present

## 2021-01-15 DIAGNOSIS — R0602 Shortness of breath: Secondary | ICD-10-CM | POA: Diagnosis present

## 2021-01-15 DIAGNOSIS — J1282 Pneumonia due to coronavirus disease 2019: Secondary | ICD-10-CM | POA: Diagnosis present

## 2021-01-15 DIAGNOSIS — M160 Bilateral primary osteoarthritis of hip: Secondary | ICD-10-CM | POA: Diagnosis present

## 2021-01-15 DIAGNOSIS — I10 Essential (primary) hypertension: Secondary | ICD-10-CM | POA: Diagnosis present

## 2021-01-15 DIAGNOSIS — Z8249 Family history of ischemic heart disease and other diseases of the circulatory system: Secondary | ICD-10-CM | POA: Diagnosis not present

## 2021-01-15 DIAGNOSIS — Z7982 Long term (current) use of aspirin: Secondary | ICD-10-CM | POA: Diagnosis not present

## 2021-01-15 DIAGNOSIS — Z888 Allergy status to other drugs, medicaments and biological substances status: Secondary | ICD-10-CM | POA: Diagnosis not present

## 2021-01-15 DIAGNOSIS — K219 Gastro-esophageal reflux disease without esophagitis: Secondary | ICD-10-CM | POA: Diagnosis present

## 2021-01-15 LAB — COMPREHENSIVE METABOLIC PANEL
ALT: 44 U/L (ref 0–44)
AST: 46 U/L — ABNORMAL HIGH (ref 15–41)
Albumin: 4.4 g/dL (ref 3.5–5.0)
Alkaline Phosphatase: 51 U/L (ref 38–126)
Anion gap: 13 (ref 5–15)
BUN: 11 mg/dL (ref 6–20)
CO2: 24 mmol/L (ref 22–32)
Calcium: 8.8 mg/dL — ABNORMAL LOW (ref 8.9–10.3)
Chloride: 100 mmol/L (ref 98–111)
Creatinine, Ser: 0.73 mg/dL (ref 0.44–1.00)
GFR, Estimated: >60 mL/min (ref >60)
Glucose, Bld: 108 mg/dL — ABNORMAL HIGH (ref 70–99)
Potassium: 3.5 mmol/L (ref 3.5–5.1)
Sodium: 137 mmol/L (ref 135–145)
Total Bilirubin: 1.1 mg/dL (ref 0.3–1.2)
Total Protein: 7.9 g/dL (ref 6.5–8.1)

## 2021-01-15 LAB — CBC WITH DIFFERENTIAL/PLATELET
Abs Immature Granulocytes: 0.01 10*3/uL (ref 0.00–0.07)
Basophils Absolute: 0 10*3/uL (ref 0.0–0.1)
Basophils Relative: 0 %
Eosinophils Absolute: 0 10*3/uL (ref 0.0–0.5)
Eosinophils Relative: 0 %
HCT: 42.8 % (ref 36.0–46.0)
Hemoglobin: 14.6 g/dL (ref 12.0–15.0)
Immature Granulocytes: 0 %
Lymphocytes Relative: 24 %
Lymphs Abs: 0.7 10*3/uL (ref 0.7–4.0)
MCH: 28.3 pg (ref 26.0–34.0)
MCHC: 34.1 g/dL (ref 30.0–36.0)
MCV: 82.9 fL (ref 80.0–100.0)
Monocytes Absolute: 0.3 10*3/uL (ref 0.1–1.0)
Monocytes Relative: 8 %
Neutro Abs: 2 10*3/uL (ref 1.7–7.7)
Neutrophils Relative %: 68 %
Platelets: 163 10*3/uL (ref 150–400)
RBC: 5.16 MIL/uL — ABNORMAL HIGH (ref 3.87–5.11)
RDW: 14.4 % (ref 11.5–15.5)
WBC: 3.1 10*3/uL — ABNORMAL LOW (ref 4.0–10.5)
nRBC: 0 % (ref 0.0–0.2)

## 2021-01-15 LAB — HEMOGLOBIN A1C
Hgb A1c MFr Bld: 5.3 % (ref 4.8–5.6)
Mean Plasma Glucose: 105.41 mg/dL

## 2021-01-15 LAB — TROPONIN I (HIGH SENSITIVITY)
Troponin I (High Sensitivity): 4 ng/L (ref <18)
Troponin I (High Sensitivity): 5 ng/L (ref <18)

## 2021-01-15 LAB — TRIGLYCERIDES: Triglycerides: 151 mg/dL — ABNORMAL HIGH (ref <150)

## 2021-01-15 LAB — CBG MONITORING, ED
Glucose-Capillary: 111 mg/dL — ABNORMAL HIGH (ref 70–99)
Glucose-Capillary: 84 mg/dL (ref 70–99)

## 2021-01-15 LAB — C-REACTIVE PROTEIN
CRP: 1.3 mg/dL — ABNORMAL HIGH (ref <1.0)
CRP: 1.1 mg/dL — ABNORMAL HIGH (ref <1.0)

## 2021-01-15 LAB — GLUCOSE, CAPILLARY
Glucose-Capillary: 114 mg/dL — ABNORMAL HIGH (ref 70–99)
Glucose-Capillary: 94 mg/dL (ref 70–99)
Glucose-Capillary: 215 mg/dL — ABNORMAL HIGH (ref 70–99)

## 2021-01-15 LAB — LACTIC ACID, PLASMA
Lactic Acid, Venous: 1.4 mmol/L (ref 0.5–1.9)
Lactic Acid, Venous: 1.6 mmol/L (ref 0.5–1.9)

## 2021-01-15 LAB — FIBRINOGEN: Fibrinogen: 423 mg/dL (ref 210–475)

## 2021-01-15 LAB — FERRITIN: Ferritin: 631 ng/mL — ABNORMAL HIGH (ref 11–307)

## 2021-01-15 LAB — HIV ANTIBODY (ROUTINE TESTING W REFLEX): HIV Screen 4th Generation wRfx: NONREACTIVE

## 2021-01-15 LAB — PROCALCITONIN: Procalcitonin: <0.1 ng/mL

## 2021-01-15 LAB — D-DIMER, QUANTITATIVE: D-Dimer, Quant: 0.95 ug/mL-FEU — ABNORMAL HIGH (ref 0.00–0.50)

## 2021-01-15 LAB — LACTATE DEHYDROGENASE: LDH: 301 U/L — ABNORMAL HIGH (ref 98–192)

## 2021-01-15 MED ORDER — ONDANSETRON HCL 4 MG/2ML IJ SOLN: 4.0000 mg | Freq: Four times a day (QID) | INTRAMUSCULAR | Status: DC | PRN

## 2021-01-15 MED ORDER — INSULIN ASPART 100 UNIT/ML ~~LOC~~ SOLN
0.0000 [IU] | Freq: Every day | SUBCUTANEOUS | Status: DC
  Administered 2021-01-15: 2 [IU] via SUBCUTANEOUS
  Filled 2021-01-15: qty 0.05

## 2021-01-15 MED ORDER — ALBUTEROL SULFATE HFA 108 (90 BASE) MCG/ACT IN AERS
2.0000 | INHALATION_SPRAY | Freq: Four times a day (QID) | RESPIRATORY_TRACT | Status: DC | PRN
  Administered 2021-01-16: 2 via RESPIRATORY_TRACT
  Filled 2021-01-15: qty 6.7

## 2021-01-15 MED ORDER — METHYLPREDNISOLONE SODIUM SUCC 500 MG IJ SOLR: 1.0000 mg/kg | Freq: Two times a day (BID) | INTRAMUSCULAR | Status: DC

## 2021-01-15 MED ORDER — ACETAMINOPHEN 325 MG PO TABS: 650.0000 mg | ORAL_TABLET | Freq: Four times a day (QID) | ORAL | Status: DC | PRN

## 2021-01-15 MED ORDER — ASPIRIN EC 81 MG PO TBEC
81.0000 mg | DELAYED_RELEASE_TABLET | Freq: Every day | ORAL | Status: DC
  Administered 2021-01-15 – 2021-01-16 (×2): 81 mg via ORAL
  Filled 2021-01-15 (×2): qty 1

## 2021-01-15 MED ORDER — GUAIFENESIN-DM 100-10 MG/5ML PO SYRP
10.0000 mL | ORAL_SOLUTION | ORAL | Status: DC | PRN
  Administered 2021-01-15 – 2021-01-16 (×2): 10 mL via ORAL
  Filled 2021-01-15 (×2): qty 10

## 2021-01-15 MED ORDER — GABAPENTIN 300 MG PO CAPS
600.0000 mg | ORAL_CAPSULE | Freq: Every day | ORAL | Status: DC
  Administered 2021-01-15 (×2): 600 mg via ORAL
  Filled 2021-01-15 (×2): qty 2

## 2021-01-15 MED ORDER — ONDANSETRON HCL 4 MG PO TABS: 4.0000 mg | ORAL_TABLET | Freq: Four times a day (QID) | ORAL | Status: DC | PRN

## 2021-01-15 MED ORDER — METHYLPREDNISOLONE SODIUM SUCC 125 MG IJ SOLR
100.0000 mg | Freq: Every day | INTRAMUSCULAR | Status: DC
  Administered 2021-01-15 – 2021-01-16 (×2): 100 mg via INTRAVENOUS
  Filled 2021-01-15 (×2): qty 2

## 2021-01-15 MED ORDER — IOHEXOL 350 MG/ML SOLN
100.0000 mL | Freq: Once | INTRAVENOUS | Status: AC | PRN
  Administered 2021-01-15: 100 mL via INTRAVENOUS

## 2021-01-15 MED ORDER — ENOXAPARIN SODIUM 40 MG/0.4ML ~~LOC~~ SOLN
40.0000 mg | SUBCUTANEOUS | Status: DC
  Administered 2021-01-15 – 2021-01-16 (×2): 40 mg via SUBCUTANEOUS
  Filled 2021-01-15 (×2): qty 0.4

## 2021-01-15 MED ORDER — HYDROCOD POLST-CPM POLST ER 10-8 MG/5ML PO SUER
5.0000 mL | Freq: Two times a day (BID) | ORAL | Status: DC | PRN
  Administered 2021-01-15 – 2021-01-16 (×3): 5 mL via ORAL
  Filled 2021-01-15 (×3): qty 5

## 2021-01-15 MED ORDER — PANTOPRAZOLE SODIUM 40 MG PO TBEC
40.0000 mg | DELAYED_RELEASE_TABLET | Freq: Every day | ORAL | Status: DC
  Administered 2021-01-15 – 2021-01-16 (×2): 40 mg via ORAL
  Filled 2021-01-15 (×2): qty 1

## 2021-01-15 MED ORDER — PREDNISONE 50 MG PO TABS: 50.0000 mg | ORAL_TABLET | Freq: Every day | ORAL | Status: DC

## 2021-01-15 MED ORDER — SODIUM CHLORIDE 0.9 % IV SOLN
200.0000 mg | Freq: Once | INTRAVENOUS | Status: DC
  Filled 2021-01-15: qty 40

## 2021-01-15 MED ORDER — ESCITALOPRAM OXALATE 10 MG PO TABS
10.0000 mg | ORAL_TABLET | Freq: Every day | ORAL | Status: DC
Start: 2021-01-15 — End: 2021-01-16
  Administered 2021-01-15 – 2021-01-16 (×2): 10 mg via ORAL
  Filled 2021-01-15 (×2): qty 1

## 2021-01-15 MED ORDER — SODIUM CHLORIDE 0.9 % IV SOLN: 100.0000 mg | Freq: Every day | INTRAVENOUS | Status: DC

## 2021-01-15 MED ORDER — INSULIN ASPART 100 UNIT/ML ~~LOC~~ SOLN
0.0000 [IU] | Freq: Three times a day (TID) | SUBCUTANEOUS | Status: DC
  Administered 2021-01-16: 3 [IU] via SUBCUTANEOUS
  Administered 2021-01-16: 2 [IU] via SUBCUTANEOUS
  Filled 2021-01-15: qty 0.15

## 2021-01-15 NOTE — ED Provider Notes (Signed)
Care assumed from [place] Robinson, PA-C, at shift change, please see their notes for full documentation of patient's complaint/HPI. Briefly, pt here with COVID 19 and SOB; she became hypoxic during her time in the ED and will require admission. While in the ED she became hypotensive as well as low at 88/51; receiving fluids at this time. Plan is to reassess blood pressure after fluids and to admit afterwards.   Physical Exam  BP 103/69   Pulse 91   Temp 100.3 F (37.9 C) (Oral)   Resp 15   LMP  (LMP Unknown)   SpO2 96%   Physical Exam Vitals and nursing note reviewed.  Constitutional:      Appearance: She is not ill-appearing.  HENT:     Head: Normocephalic and atraumatic.  Eyes:     Conjunctiva/sclera: Conjunctivae normal.  Cardiovascular:     Rate and Rhythm: Normal rate and regular rhythm.  Pulmonary:     Effort: Pulmonary effort is normal.     Breath sounds: Normal breath sounds.  Skin:    General: Skin is warm and dry.     Coloration: Skin is not jaundiced.  Neurological:     Mental Status: She is alert.     ED Course/Procedures   Clinical Course as of 01/15/21 0106  Thu Jan 14, 2021  2256 Patient has become hypoxic to 87% on room air.  She is placed on 2 L nasal cannula with improvement to 92%.  She remains tachypneic and appears short of breath.  Will obtain additional blood work and admit for O2 supplementation of the setting of COVID-19. [JR]    Clinical Course User Index [JR] Robinson, [place] N, PA-C    Procedures  MDM  On reevaluation pt's blood pressure improving with fluids with most recent 120/74. CMP without electrolyte abnormalities. Will call for admission.   Discussed case with [facility] Dr. Julian Reil who agrees to evaluate patient for admission.       Tanda Rockers, PA-C 01/15/21 0108    Paula Libra, MD 01/15/21 [phone]

## 2021-01-15 NOTE — H&P (Addendum)
History and Physical    Norma [name] KYH:[id number] DOB: 1961/11/15 DOA: 01/14/2021  PCP: Johny Blamer, MD  Patient coming from: Home  I have personally briefly reviewed patient's old medical records in [facility]  Chief Complaint: COVID, SOB  HPI: Norma [name] is a 60 y.o. female with medical history significant of HTN, DM2.  Pt unvaccinated to COVID.  Pt had onset of cough, SOB, fevers, on 1/9, tested positive for COVID on 1/11 (see photo in objective section).  For the past 3-4 days however has had worsening SOB, now SOB severe.  Not helped by albuterol inhaler at home.  Symptoms constant.  No CP, abd pain, urinary symptoms.   ED Course: CXR unimpressive.  Satting mid to upper 90s on RA but severe dyspnea / tachypnea noted.   Review of Systems: As per HPI, otherwise all review of systems negative.  Past Medical History:  Diagnosis Date  . Arthritis    hips  . Asthma   . Costochondritis   . Diabetes (HCC)   . GERD (gastroesophageal reflux disease)   . Headache   . HTN (hypertension)   . Hyperlipidemia   . Obesity   . Palpitations   . Paresthesias    on Neurontin  . Pinched nerve in neck    pt thinks it's between C3 and C4  . PONV (postoperative nausea and vomiting)   . PVC (premature ventricular contraction)   . Sleep apnea    on CPAP; now moderate.     Past Surgical History:  Procedure Laterality Date  . BREAST MASS EXCISION Left   . CESAREAN SECTION    . CHOLECYSTECTOMY N/A 09/23/2016   Procedure: LAPAROSCOPIC CHOLECYSTECTOMY WITH INTRAOPERATIVE CHOLANGIOGRAM;  Surgeon: Luretha Murphy, MD;  Location: [facility];  Service: General;  Laterality: N/A;  . EYE SURGERY     to correct cross sidedness   . novasure procedure     . RADIOACTIVE SEED GUIDED EXCISIONAL BREAST BIOPSY Left 07/20/2018   Procedure: RADIOACTIVE SEED GUIDED EXCISIONAL BREAST BIOPSY;  Surgeon: Luretha Murphy, MD;  Location: [facility];  Service: General;  Laterality:  Left;  . TUBAL LIGATION       reports that she has never smoked. She has never used smokeless tobacco. She reports that she does not drink alcohol and does not use drugs.  Allergies  Allergen Reactions  . Beconase Aq [Beclomethasone Diprop Monohyd] Shortness Of Breath  . Atenolol Other (See Comments)    Depression and crying   . Other     chloramycin eye drops - shortness of breath  . Cephalexin Rash  . Codeine Itching and Rash    Family History  Problem Relation Age of Onset  . Other Mother        benign brain tumor   . Stroke Father   . Heart attack Father   . Multiple sclerosis Niece      Prior to Admission medications   Medication Sig Start Date End Date Taking? Authorizing Provider  albuterol (PROVENTIL HFA;VENTOLIN HFA) 108 (90 Base) MCG/ACT inhaler Inhale 2 puffs into the lungs every 6 (six) hours as needed for wheezing or shortness of breath.   Yes [provider]  aspirin 81 MG tablet Take 81 mg by mouth daily.   Yes [provider]  CINNAMON PO Take 1,000 mg by mouth daily.   Yes [provider]  cyclobenzaprine (FLEXERIL) 5 MG tablet Take 5 mg by mouth at bedtime as needed for muscle spasms.  Yes [provider]  escitalopram (LEXAPRO) 5 MG tablet Take 10 mg by mouth daily.  03/10/20  Yes [provider]  gabapentin (NEURONTIN) 600 MG tablet Take 600 mg by mouth at bedtime.   Yes [provider]  lisinopril (PRINIVIL,ZESTRIL) 40 MG tablet Take 40 mg by mouth daily.   Yes [provider]  metFORMIN (GLUCOPHAGE-XR) 500 MG 24 hr tablet Take 500 mg by mouth daily with breakfast. 03/10/20  Yes [provider]  metoprolol succinate (TOPROL-XL) 50 MG 24 hr tablet Take 50 mg by mouth every evening.  08/08/16  Yes [provider]  omeprazole (PRILOSEC) 20 MG capsule Take 20 mg by mouth daily. 03/31/20  Yes [provider]  rosuvastatin (CRESTOR) 5 MG tablet Take 5 mg by mouth once a week.   04/07/20  Yes [provider]  VITAMIN D PO Take 5,000 Int'l Units by mouth daily.   Yes [provider]  ibuprofen (ADVIL,MOTRIN) 800 MG tablet Take 800 mg by mouth every 8 (eight) hours as needed for pain. 07/25/16   [provider]  Lancets (ONETOUCH DELICA PLUS [place]) MISC Apply 1 each topically daily. 03/10/20   [provider]  [facility] VERIO test strip 1 each daily. 03/10/20   [provider]    Physical Exam: Vitals:   01/14/21 2330 01/15/21 0015 01/15/21 0030 01/15/21 0227  BP: (!) 88/51 120/74 103/69   Pulse: 95 95 91   Resp: (!) 33 (!) 22 15   Temp:      TempSrc:      SpO2: 93% 97% 96%   Weight:    90.7 kg  Height:    5\' 2"  (1.575 m)    Constitutional: Respiratory distress noted. Eyes: PERRL, lids and conjunctivae normal ENMT: Mucous membranes are moist. Posterior pharynx clear of any exudate or lesions.Normal dentition.  Neck: normal, supple, no masses, no thyromegaly Respiratory: Tachypnea noted. Cardiovascular: Regular rate and rhythm, no murmurs / rubs / gallops. No extremity edema. 2+ pedal pulses. No carotid bruits.  Abdomen: no tenderness, no masses palpated. No hepatosplenomegaly. Bowel sounds positive.  Musculoskeletal: no clubbing / cyanosis. No joint deformity upper and lower extremities. Good ROM, no contractures. Normal muscle tone.  Skin: no rashes, lesions, ulcers. No induration Neurologic: CN 2-12 grossly intact. Sensation intact, DTR normal. Strength 5/5 in all 4.  Psychiatric: Normal judgment and insight. Alert and oriented x 3. Normal mood.      Labs on Admission: I have personally reviewed following labs and imaging studies  CBC: Recent Labs  Lab 01/14/21 2335  WBC 3.1*  NEUTROABS 2.0  HGB 14.6  HCT 42.8  MCV 82.9  PLT 163   Basic Metabolic Panel: Recent Labs  Lab 01/14/21 2335  NA 137  K 3.5  CL 100  CO2 24  GLUCOSE 108*  BUN 11  CREATININE 0.73  CALCIUM 8.8*   GFR: Estimated  Creatinine Clearance: 78.3 mL/min (by C-G formula based on SCr of 0.73 mg/dL). Liver Function Tests: Recent Labs  Lab 01/14/21 2335  AST 46*  ALT 44  ALKPHOS 51  BILITOT 1.1  PROT 7.9  ALBUMIN 4.4   No results for input(s): LIPASE, AMYLASE in the last 168 hours. No results for input(s): AMMONIA in the last 168 hours. Coagulation Profile: No results for input(s): INR, PROTIME in the last 168 hours. Cardiac Enzymes: No results for input(s): CKTOTAL, CKMB, CKMBINDEX, TROPONINI in the last 168 hours. BNP (last 3 results) No results for input(s): PROBNP in the  last 8760 hours. HbA1C: No results for input(s): HGBA1C in the last 72 hours. CBG: Recent Labs  Lab 01/14/21 2104 01/15/21 0258  GLUCAP 106* 111*   Lipid Profile: Recent Labs    01/14/21 2335  TRIG 151*   Thyroid Function Tests: No results for input(s): TSH, T4TOTAL, FREET4, T3FREE, THYROIDAB in the last 72 hours. Anemia Panel: No results for input(s): VITAMINB12, FOLATE, FERRITIN, TIBC, IRON, RETICCTPCT in the last 72 hours. Urine analysis: No results found for: COLORURINE, APPEARANCEUR, LABSPEC, PHURINE, GLUCOSEU, HGBUR, BILIRUBINUR, KETONESUR, PROTEINUR, UROBILINOGEN, NITRITE, LEUKOCYTESUR  Radiological Exams on Admission: DG Chest Port 1 View  Result Date: 01/14/2021 CLINICAL DATA:  COVID-19 positivity with shortness of breath EXAM: PORTABLE CHEST 1 VIEW COMPARISON:  08/27/2010 FINDINGS: The heart size and mediastinal contours are within normal limits. Both lungs are clear. The visualized skeletal structures are unremarkable. IMPRESSION: No active disease. Electronically Signed   By: Alcide Clever M.D.   On: 01/14/2021 20:12    EKG: Independently reviewed.  Assessment/Plan Principal Problem:   COVID-19 virus infection Active Problems:   DM2 (diabetes mellitus, type 2) (HCC)   HTN (hypertension)    1. COVID-19 with respiratory symptoms - 1. No hypoxia at this point. 2. Offered remdesivir but pt refused,  (pt over 10 days into illness anyhow). 3. Add steroids if pt develops new O2 requirement or if CRP comes back every elevated. 4. CTA chest pending to r/o PE. 5. CRP pending 6. Daily labs 2. HTN - pt actually hypotensive in ED initially 1. BP improved after IVF in ED 2. CTA chest pending to r/o PE 3. Holding home BP meds 4. Procalcitonin < 0.10, thus superimposed bacterial infection seems unlikely. 3. DM2 - 1. Hold metformin 2. Mod scale SSI AC/HS  DVT prophylaxis: Lovenox Code Status: Full Family Communication: No family in room Disposition Plan: Home after admit Consults called: None Admission status: Place in 23   Jayce Boyko M. DO [facility]  How to contact the [facility] Attending or Consulting provider 7A - 7P or covering provider during after hours 7P -7A, for this patient?  1. Check the care team in [facility] and look for a) attending/consulting [facility] provider listed and b) the [facility] team listed 2. Log into [email or web address]  Amion Physician Scheduling and messaging for groups and whole hospitals  On call and physician scheduling software for group practices, residents, hospitalists and other medical providers for call, clinic, rotation and shift schedules. On[employer] is a hospital-wide system for scheduling doctors and paging doctors on call. EasyPlot is for scientific plotting and data analysis.  [email or web address]  and use [place]'s universal password to access. If you do not have the password, please contact the hospital operator.  3. Locate the [facility] provider you are looking for under [facility] and page to a number that you can be directly reached. 4. If you still have difficulty reaching the provider, please page the [facility] (Director on Call) for the Hospitalists listed on amion for assistance.  01/15/2021, 3:04 AM

## 2021-01-15 NOTE — Plan of Care (Signed)

## 2021-01-15 NOTE — Progress Notes (Signed)
PROGRESS NOTE    Norma [name]  IEP:[id number] DOB: November 12, 1961 DOA: 01/14/2021 PCP: Johny Blamer, MD   Brief Narrative: Norma [name] is a 61 y.o. female with a history of hypertension, diabetes, obesity. Patient presented secondary to known COVID-19 infection and worsening dyspnea. CTA chest significant for multifocal pneumonia.   Assessment & Plan:   Principal Problem:   COVID-19 virus infection Active Problems:   DM2 (diabetes mellitus, type 2) (HCC)   HTN (hypertension)   Acute respiratory failure with hypoxia Documented hypoxia with a low SpO2 of 87% on 1/20. Initially started on 2 L of oxygen via [place]. CTA chest significant for multifocal pneumonia and negative for acute PE. -Wean to room air as able -Keep SpO2 >90% -PT/OT  COVID-19 Pneumonia Patient is unvaccinated. Declined remdesivir on admission. CRP and d-dimer mildly elevated. -Start Solu-medrol 1 mg/kg daily secondary to evidence of multifocal pneumonia in addition to hypoxia  Primary hypertension Patient is on lisinopril and metoprolol as an outpatient. Both were held on admission -Resume home metoprolol  Diabetes mellitus, type 2 Patient is on metformin as an outpatient. -Continue SSI  Mood disorder -Continue Lexapro  GERD -Continue Protonix  Hyperlipidemia -Continue Crestor  Obesity Body mass index is 36.58 kg/m.   DVT prophylaxis: Lovenox Code Status:   Code Status: Full Code Family Communication: None at bedside Disposition Plan: Discharge home likely in several days/weeks pending improvement of dyspnea/hypoxia and stability of COVID-19 infection   Consultants:   None  Procedures:   None  Antimicrobials:  None    Subjective: Dyspnea with ambulation. Otherwise feels well.  Objective: Vitals:   01/15/21 0030 01/15/21 0227 01/15/21 0400 01/15/21 0617  BP: 103/69  125/82 117/68  Pulse: 91  99 83  Resp: 15  (!) 32 (!) 25  Temp:      TempSrc:      SpO2: 96%  97% 96%   Weight:  90.7 kg    Height:  5\' 2"  (1.575 m)     No intake or output data in the 24 hours ending 01/15/21 0745 Filed Weights   01/15/21 0227  Weight: 90.7 kg    Examination:  General exam: Appears calm and comfortable Respiratory system: Diminished, rales Respiratory effort normal. Cardiovascular system: S1 & S2 heard, RRR. No murmurs, rubs, gallops or clicks. Gastrointestinal system: Abdomen is nondistended, soft and nontender. No organomegaly or masses felt. Normal bowel sounds heard. Central nervous system: Alert and oriented. No focal neurological deficits. Musculoskeletal: No edema. No calf tenderness Skin: No cyanosis. No rashes Psychiatry: Judgement and insight appear normal. Mood & affect appropriate.     Data Reviewed: I have personally reviewed following labs and imaging studies  CBC Lab Results  Component Value Date   WBC 3.1 (L) 01/14/2021   RBC 5.16 (H) 01/14/2021   HGB 14.6 01/14/2021   HCT 42.8 01/14/2021   MCV 82.9 01/14/2021   MCH 28.3 01/14/2021   PLT 163 01/14/2021   MCHC 34.1 01/14/2021   RDW 14.4 01/14/2021   LYMPHSABS 0.7 01/14/2021   MONOABS 0.3 01/14/2021   EOSABS 0.0 01/14/2021   BASOSABS 0.0 01/14/2021     Last metabolic panel Lab Results  Component Value Date   NA 137 01/14/2021   K 3.5 01/14/2021   CL 100 01/14/2021   CO2 24 01/14/2021   BUN 11 01/14/2021   CREATININE 0.73 01/14/2021   GLUCOSE 108 (H) 01/14/2021   GFRNONAA >60 01/14/2021   GFRAA 118 05/13/2020   CALCIUM 8.8 (L) 01/14/2021  PROT 7.9 01/14/2021   ALBUMIN 4.4 01/14/2021   LABGLOB 2.2 05/13/2020   AGRATIO 2.1 05/13/2020   BILITOT 1.1 01/14/2021   ALKPHOS 51 01/14/2021   AST 46 (H) 01/14/2021   ALT 44 01/14/2021   ANIONGAP 13 01/14/2021    CBG (last 3)  Recent Labs    01/14/21 2104 01/15/21 0258 01/15/21 0726  GLUCAP 106* 111* 84     GFR: Estimated Creatinine Clearance: 78.3 mL/min (by C-G formula based on SCr of 0.73 mg/dL).  Coagulation  Profile: No results for input(s): INR, PROTIME in the last 168 hours.  Recent Results (from the past 240 hour(s))  Blood Culture (routine x 2)     Status: None (Preliminary result)   Collection Time: 01/14/21 11:35 PM   Specimen: BLOOD  Result Value Ref Range Status   Specimen Description   Final    BLOOD SITE NOT SPECIFIED Performed at [facility], [address] [place], [place] [address]    Special Requests   Final    BOTTLES DRAWN AEROBIC AND ANAEROBIC Blood Culture adequate volume Performed at [facility], [address] [place], [place] [address]    Culture PENDING  Incomplete   Report Status PENDING  Incomplete  Blood Culture (routine x 2)     Status: None (Preliminary result)   Collection Time: 01/14/21 11:35 PM   Specimen: BLOOD  Result Value Ref Range Status   Specimen Description   Final    BLOOD SITE NOT SPECIFIED Performed at [facility], [address], [place], [place] [address]    Special Requests   Final    BOTTLES DRAWN AEROBIC ONLY Blood Culture results may not be optimal due to an inadequate volume of blood received in culture bottles Performed at [facility], [address], [place], [place] [address]    Culture PENDING  Incomplete   Report Status PENDING  Incomplete        Radiology Studies: CT ANGIO CHEST PE W OR WO CONTRAST  Result Date: 01/15/2021 CLINICAL DATA:  Shortness of breath COVID positive EXAM: CT ANGIOGRAPHY CHEST WITH CONTRAST TECHNIQUE: Multidetector CT imaging of the chest was performed using the standard protocol during bolus administration of intravenous contrast. Multiplanar CT image reconstructions and MIPs were obtained to evaluate the vascular anatomy. CONTRAST:  OMNIPAQUE IOHEXOL 350 MG/ML SOLN COMPARISON:  Radiograph same day FINDINGS: Cardiovascular: There is a optimal opacification of the pulmonary arteries. There is no central,segmental, or subsegmental filling  defects within the pulmonary arteries. The heart is normal in size. No pericardial effusion or thickening. No evidence right heart strain. There is normal three-vessel brachiocephalic anatomy without proximal stenosis. The thoracic aorta is normal in appearance. Mediastinum/Nodes: Scattered prevascular, pretracheal, and subcarinal lymph nodes are seen. Within the right pretracheal space there is a 9 mm lymph node seen. Thyroid gland, trachea, and esophagus demonstrate no significant findings. Lungs/Pleura: Extensive multifocal patchy airspace opacities are seen throughout both lungs, predominantly within both lower lungs. No pleural effusion or pneumothorax. Upper Abdomen: No acute abnormalities present in the visualized portions of the upper abdomen. Musculoskeletal: No chest wall abnormality. No acute or significant osseous findings. Review of the MIP images confirms the above findings. IMPRESSION: No central, segmental, or subsegmental pulmonary embolism Multifocal patchy airspace opacities throughout both lungs, predominantly within the lower lungs, consistent with COVID pneumonia Electronically Signed   By: Jonna Clark M.D.   On: 01/15/2021 03:10   DG Chest [facility]  Result Date: 01/14/2021 CLINICAL DATA:  COVID-19 positivity with shortness of breath EXAM: PORTABLE CHEST 1 VIEW COMPARISON:  08/27/2010 FINDINGS: The heart size and mediastinal contours are within normal limits. Both lungs are clear. The visualized skeletal structures are unremarkable. IMPRESSION: No active disease. Electronically Signed   By: Alcide Clever M.D.   On: 01/14/2021 20:12        Scheduled Meds: . aspirin EC  81 mg Oral Daily  . enoxaparin (LOVENOX) injection  40 mg Subcutaneous Q24H  . escitalopram  10 mg Oral Daily  . gabapentin  600 mg Oral QHS  . insulin aspart  0-15 Units Subcutaneous TID WC  . insulin aspart  0-5 Units Subcutaneous QHS  . pantoprazole  40 mg Oral Daily   Continuous Infusions:   LOS: 1 day      Jacquelin Hawking, MD [facility] 01/15/2021, 7:45 AM  If 7PM-7AM, please contact night-coverage [email or web address]

## 2021-01-16 DIAGNOSIS — U071 COVID-19: Secondary | ICD-10-CM

## 2021-01-16 LAB — CBC WITH DIFFERENTIAL/PLATELET
Abs Immature Granulocytes: 0.01 10*3/uL (ref 0.00–0.07)
Abs Immature Granulocytes: 0.01 10*3/uL (ref 0.00–0.07)
Basophils Absolute: 0 10*3/uL (ref 0.0–0.1)
Basophils Absolute: 0 10*3/uL (ref 0.0–0.1)
Basophils Relative: 0 %
Basophils Relative: 0 %
Eosinophils Absolute: 0 10*3/uL (ref 0.0–0.5)
Eosinophils Absolute: 0 10*3/uL (ref 0.0–0.5)
Eosinophils Relative: 0 %
Eosinophils Relative: 0 %
HCT: 36.9 % (ref 36.0–46.0)
HCT: 40 % (ref 36.0–46.0)
Hemoglobin: 12.4 g/dL (ref 12.0–15.0)
Hemoglobin: 13.4 g/dL (ref 12.0–15.0)
Immature Granulocytes: 1 %
Immature Granulocytes: 1 %
Lymphocytes Relative: 17 %
Lymphocytes Relative: 18 %
Lymphs Abs: 0.3 10*3/uL — ABNORMAL LOW (ref 0.7–4.0)
Lymphs Abs: 0.4 10*3/uL — ABNORMAL LOW (ref 0.7–4.0)
MCH: 28.1 pg (ref 26.0–34.0)
MCH: 27.9 pg (ref 26.0–34.0)
MCHC: 33.6 g/dL (ref 30.0–36.0)
MCHC: 33.5 g/dL (ref 30.0–36.0)
MCV: 83.5 fL (ref 80.0–100.0)
MCV: 83.2 fL (ref 80.0–100.0)
Monocytes Absolute: 0.1 10*3/uL (ref 0.1–1.0)
Monocytes Absolute: 0.2 10*3/uL (ref 0.1–1.0)
Monocytes Relative: 4 %
Monocytes Relative: 9 %
Neutro Abs: 1.2 10*3/uL — ABNORMAL LOW (ref 1.7–7.7)
Neutro Abs: 1.6 10*3/uL — ABNORMAL LOW (ref 1.7–7.7)
Neutrophils Relative %: 78 %
Neutrophils Relative %: 72 %
Platelets: 128 10*3/uL — ABNORMAL LOW (ref 150–400)
Platelets: 167 10*3/uL (ref 150–400)
RBC: 4.42 MIL/uL (ref 3.87–5.11)
RBC: 4.81 MIL/uL (ref 3.87–5.11)
RDW: 14.1 % (ref 11.5–15.5)
RDW: 14.2 % (ref 11.5–15.5)
WBC: 1.5 10*3/uL — ABNORMAL LOW (ref 4.0–10.5)
WBC: 2.2 10*3/uL — ABNORMAL LOW (ref 4.0–10.5)
nRBC: 0 % (ref 0.0–0.2)
nRBC: 0 % (ref 0.0–0.2)

## 2021-01-16 LAB — COMPREHENSIVE METABOLIC PANEL
ALT: 37 U/L (ref 0–44)
AST: 38 U/L (ref 15–41)
Albumin: 3.6 g/dL (ref 3.5–5.0)
Alkaline Phosphatase: 48 U/L (ref 38–126)
Anion gap: 10 (ref 5–15)
BUN: 9 mg/dL (ref 6–20)
CO2: 25 mmol/L (ref 22–32)
Calcium: 8.4 mg/dL — ABNORMAL LOW (ref 8.9–10.3)
Chloride: 101 mmol/L (ref 98–111)
Creatinine, Ser: 0.53 mg/dL (ref 0.44–1.00)
GFR, Estimated: >60 mL/min (ref >60)
Glucose, Bld: 193 mg/dL — ABNORMAL HIGH (ref 70–99)
Potassium: 3.9 mmol/L (ref 3.5–5.1)
Sodium: 136 mmol/L (ref 135–145)
Total Bilirubin: 0.7 mg/dL (ref 0.3–1.2)
Total Protein: 6.6 g/dL (ref 6.5–8.1)

## 2021-01-16 LAB — GLUCOSE, CAPILLARY
Glucose-Capillary: 144 mg/dL — ABNORMAL HIGH (ref 70–99)
Glucose-Capillary: 170 mg/dL — ABNORMAL HIGH (ref 70–99)

## 2021-01-16 LAB — D-DIMER, QUANTITATIVE: D-Dimer, Quant: 0.65 ug/mL-FEU — ABNORMAL HIGH (ref 0.00–0.50)

## 2021-01-16 LAB — C-REACTIVE PROTEIN: CRP: 0.7 mg/dL (ref <1.0)

## 2021-01-16 MED ORDER — PHENOL 1.4 % MT LIQD
1.0000 | OROMUCOSAL | Status: DC | PRN
  Administered 2021-01-16: 1 via OROMUCOSAL
  Filled 2021-01-16: qty 177

## 2021-01-16 MED ORDER — DEXAMETHASONE 6 MG PO TABS: 6.0000 mg | ORAL_TABLET | Freq: Every day | ORAL | 0 refills | Status: AC

## 2021-01-16 MED ORDER — GUAIFENESIN-DM 100-10 MG/5ML PO SYRP: 10.0000 mL | ORAL_SOLUTION | ORAL | 0 refills | Status: DC | PRN

## 2021-01-16 NOTE — Discharge Instructions (Signed)
Norma [name],  You were in the hospital because of your COVID-19 infection which has caused you to develop pneumonia. You have responded well to oxygen and steroids and your lab values look much better. Please continue to take the steroid as prescribed at home and continue to use the oxygen as prescribed. Please also schedule a follow-up visit with your PCP (likely virtual). Your PCP will help you wean off of the oxygen. It is okay if your oxygen goes down to the mid-80% for some seconds-minute, but it should not stay there indefinitely. Please call your doctor immediately if you are having trouble keeping your oxygen above 90% and you are having trouble breathing. If unable to reach your doctor in a timely fashion, please call 911.

## 2021-01-16 NOTE — TOC Initial Note (Addendum)
Transition of Care [facility]) - Initial/Assessment Note    Patient Details  Name: MAHEEN CWIKLA MRN: [id number] Date of Birth: 10/17/1961  Transition of Care (TOC) CM/SW Contact:    Armanda Heritage, RN Phone Number: 01/16/2021, 1:11 PM  Clinical Narrative:                 CM noted home O2 orders and desaturation screen.  Referral for home O2 given to [employer] rep Jermaine.  Expected Discharge Plan: Home/Self Care Barriers to Discharge: No Barriers Identified   Patient Goals and CMS Choice Patient states their goals for this hospitalization and ongoing recovery are:: to go home      Expected Discharge Plan and Services Expected Discharge Plan: Home/Self Care       Living arrangements for the past 2 months: Single Family Home                                      Prior Living Arrangements/Services Living arrangements for the past 2 months: Single Family Home   Patient language and need for interpreter reviewed:: Yes Do you feel safe going back to the place where you live?: Yes      Need for Family Participation in Patient Care: No (Comment) Care giver support system in place?: No (comment)   Criminal Activity/Legal Involvement Pertinent to Current Situation/Hospitalization: No - Comment as needed  Activities of Daily Living Home Assistive Devices/Equipment: Eyeglasses,CBG Meter ADL Screening (condition at time of admission) Patient's cognitive ability adequate to safely complete daily activities?: Yes Is the patient deaf or have difficulty hearing?: No Does the patient have difficulty seeing, even when wearing glasses/contacts?: No Does the patient have difficulty concentrating, remembering, or making decisions?: No Patient able to express need for assistance with ADLs?: Yes Does the patient have difficulty dressing or bathing?: No Independently performs ADLs?: Yes (appropriate for developmental age) Does the patient have difficulty walking or climbing  stairs?: Yes (secondary to shortness of breath) Weakness of Legs: Both Weakness of Arms/Hands: None  Permission Sought/Granted                  Emotional Assessment Appearance:: Appears stated age         Psych Involvement: No (comment)  Admission diagnosis:  Acute hypoxemic respiratory failure due to COVID-19 (HCC) [U07.1, J96.01] COVID-19 virus infection [U07.1] Patient Active Problem List   Diagnosis Date Noted  . DM2 (diabetes mellitus, type 2) (HCC) 01/15/2021  . HTN (hypertension) 01/15/2021  . COVID-19 virus infection 01/15/2021  . Imbalance 05/13/2020  . PVC (premature ventricular contraction) 12/07/2011  . URI (upper respiratory infection) 12/07/2011  . Mixed hyperlipidemia 12/07/2011  . Dizzy 12/07/2011   PCP:  Johny Blamer, MD Pharmacy:   [facility] [address], [place] - [address] [place] [address] Phone: [phone] Fax: [phone]     Social Determinants of Health (SDOH) Interventions    Readmission Risk Interventions No flowsheet data found.

## 2021-01-16 NOTE — Progress Notes (Signed)
Occupational Therapy Screen Patient Details Name: Norma [name] MRN: [id number] DOB: [birth year] Today's Date: 01/16/2021    History of Present Illness Pt screened in collaboration with PT Evaluation who disclosed that pt is mobilizing herself to the bathroom without assitance and is performing her ADLs at baseline.  No skilled OT needs identified and OT to sign off.   Victorino Dike, OT Acute Rehab Services Office: [phone] 01/16/2021

## 2021-01-16 NOTE — Evaluation (Signed)
Physical Therapy Evaluation Patient Details Name: Norma [name] MRN: [id number] DOB: 11-26-1961 Today's Date: 01/16/2021   History of Present Illness  Pt admitted 2* SOB and dx with COVID, Pt wtih hx of DM  Clinical Impression  Pt admitted as above and presenting with functional mobility limitations 2* limited endurance and mild ambulatory balance deficits.  Pt up to ambulate in room unassisted but with mild instability noted with fatigue and pt reporting dizziness with SaO2 of 88% on RA.  Pt reviewed breathing techniques, use of spirometer and flutter valve, and basic sit/stand, LAQ and abd/add to initiate therex program.    Follow Up Recommendations No PT follow up    Equipment Recommendations  None recommended by PT    Recommendations for Other Services       Precautions / Restrictions Precautions Precautions: Fall Restrictions Weight Bearing Restrictions: No      Mobility  Bed Mobility               General bed mobility comments: NT - pt up in chair on arrival and requests back to same    Transfers Overall transfer level: Needs assistance Equipment used: None Transfers: Sit to/from Stand Sit to Stand: Supervision         General transfer comment: cues for use of UEs to self assist  Ambulation/Gait Ambulation/Gait assistance: Min guard;Supervision Gait Distance (Feet): 44 Feet (and additional 22) Assistive device: None Gait Pattern/deviations: Step-through pattern;Decreased step length - right;Decreased step length - left;Shuffle;Trunk flexed     General Gait Details: Pt ambulated sans assist but with noted increased instability with increased fatigue - pt reports dizziness with SaO2 at 88% on 3L  Stairs            Wheelchair Mobility    Modified Rankin (Stroke Patients Only)       Balance Overall balance assessment: Mild deficits observed, not formally tested                                           Pertinent  Vitals/Pain Pain Assessment: No/denies pain    Home Living Family/patient expects to be discharged to:: Private residence Living Arrangements: Spouse/significant other Available Help at Discharge: Family Type of Home: House Home Access: Stairs to enter Entrance Stairs-Rails: None Entrance Stairs-Number of Steps: 3 Home Layout: One level Home Equipment: Environmental consultant - 2 wheels;Wheelchair - manual      Prior Function Level of Independence: Independent               Hand Dominance        Extremity/Trunk Assessment   Upper Extremity Assessment Upper Extremity Assessment: Overall WFL for tasks assessed    Lower Extremity Assessment Lower Extremity Assessment: Overall WFL for tasks assessed       Communication   Communication: No difficulties  Cognition Arousal/Alertness: Awake/alert Behavior During Therapy: WFL for tasks assessed/performed Overall Cognitive Status: Within Functional Limits for tasks assessed                                        General Comments      Exercises     Assessment/Plan    PT Assessment Patient needs continued PT services  PT Problem List Decreased activity tolerance;Decreased balance;Decreased mobility       PT Treatment Interventions  DME instruction;Gait training;Stair training;Functional mobility training;Therapeutic activities;Therapeutic exercise;Patient/family education    PT Goals (Current goals can be found in the Care Plan section)  Acute Rehab PT Goals Patient Stated Goal: Regain IND and get off O2 PT Goal Formulation: With patient Time For Goal Achievement: 01/30/21 Potential to Achieve Goals: Good    Frequency Min 3X/week   Barriers to discharge        Co-evaluation               AM-PAC PT "6 Clicks" Mobility  Outcome Measure Help needed turning from your back to your side while in a flat bed without using bedrails?: None Help needed moving from lying on your back to sitting on the side  of a flat bed without using bedrails?: None Help needed moving to and from a bed to a chair (including a wheelchair)?: A Little Help needed standing up from a chair using your arms (e.g., wheelchair or bedside chair)?: A Little Help needed to walk in hospital room?: A Little Help needed climbing 3-5 steps with a railing? : A Little 6 Click Score: 20    End of Session Equipment Utilized During Treatment: Gait belt;Oxygen Activity Tolerance: Patient tolerated treatment well;Patient limited by fatigue Patient left: in chair;with call bell/phone within reach Nurse Communication: Mobility status PT Visit Diagnosis: Unsteadiness on feet (R26.81);Difficulty in walking, not elsewhere classified (R26.2)    Time: 0940-1005 PT Time Calculation (min) (ACUTE ONLY): 25 min   Charges:     PT Treatments $Gait Training: 8-22 mins        Mauro Kaufmann PT Acute Rehabilitation Services Pager [phone] Office [phone]   Vinod Mikesell 01/16/2021, 1:10 PM

## 2021-01-16 NOTE — Progress Notes (Addendum)
SATURATION QUALIFICATIONS: (This note is used to comply with regulatory documentation for home oxygen)  Patient Saturations on Room Air at Rest = 89%  Patient Saturations on Room Air while Ambulating = 84%  Patient Saturations on 2 Liters of oxygen while Ambulating = 92%  Please briefly explain why patient needs home oxygen: low oxygenation on room air at rest and while ambulating.

## 2021-01-16 NOTE — Discharge Summary (Signed)
Physician Discharge Summary  [name] TIR:[id number] DOB: 02/14/1961 DOA: 01/14/2021  PCP: Johny Blamer, MD  Admit date: 01/14/2021 Discharge date: 01/16/2021  Admitted From: Home Disposition: Home  Recommendations for Outpatient Follow-up:  1. Follow up with PCP in 1 week 2. Please obtain CBC with differential in one week 3. Please follow up on the following pending results: None  Home Health: None Equipment/Devices: Oxygen  Discharge Condition: Stable CODE STATUS: Full code Diet recommendation: Heart healthy   Brief/Interim Summary:  Admission HPI written by Lyda Perone, MD   HPI: Norma [name] is a 60 y.o. female with medical history significant of HTN, DM2.  Pt unvaccinated to COVID.  Pt had onset of cough, SOB, fevers, on 1/9, tested positive for COVID on 1/11 (see photo in objective section).  For the past 3-4 days however has had worsening SOB, now SOB severe.  Not helped by albuterol inhaler at home.  Symptoms constant.  No CP, abd pain, urinary symptoms.   Hospital course:  Acute respiratory failure with hypoxia Documented hypoxia with a low SpO2 of 87% on 1/20. Initially started on 2 L of oxygen via [place]. CTA chest significant for multifocal pneumonia and negative for acute PE. Patient stable on 2 L of oxygen. Patient with oxygen desaturations down to 84% requiring the use of oxygen on discharge. Patient to wean as able with help of her PCP. PT recommendations for no PT. Discontinued ibuprofen.  COVID-19 Pneumonia Patient is unvaccinated. Declined remdesivir on admission. CRP and d-dimer mildly elevated on admission and both trended down. Patient transitioned to decadron 6 mg daily to complete a total course of 10 days of steroids.  Leukopenia Mild. Mostly lymphopenia but did develop some mild neutropenia. ANC as low as 1200, but increased to 1600 on repeat. Recommend outpatient CBC for recheck. No concern for bacterial infection at this  time.  Primary hypertension Patient is on lisinopril and metoprolol as an outpatient. Both were held on admission. Can resume on discharge.  Diabetes mellitus, type 2 Patient is on metformin as an outpatient. Resume on discharge  Mood disorder Continue Lexapro  GERD Continue Protonix  Hyperlipidemia Continue Crestor  Obesity Body mass index is 36.58 kg/m.  Discharge Diagnoses:  Principal Problem:   COVID-19 virus infection Active Problems:   DM2 (diabetes mellitus, type 2) (HCC)   HTN (hypertension)    Discharge Instructions  Discharge Instructions    Call MD for:  difficulty breathing, headache or visual disturbances   Complete by: As directed    Increase activity slowly   Complete by: As directed      Allergies as of 01/16/2021      Reactions   Beconase Aq [beclomethasone Diprop Monohyd] Shortness Of Breath   Atenolol Other (See Comments)   Depression and crying    Other    chloramycin eye drops - shortness of breath   Cephalexin Rash   Codeine Itching, Rash      Medication List    STOP taking these medications   ibuprofen 800 MG tablet Commonly known as: ADVIL     TAKE these medications   albuterol 108 (90 Base) MCG/ACT inhaler Commonly known as: VENTOLIN HFA Inhale 2 puffs into the lungs every 6 (six) hours as needed for wheezing or shortness of breath.   aspirin 81 MG tablet Take 81 mg by mouth daily.   CINNAMON PO Take 1,000 mg by mouth daily.   cyclobenzaprine 5 MG tablet Commonly known as: FLEXERIL Take 5 mg  by mouth at bedtime as needed for muscle spasms.   dexamethasone 6 MG tablet Commonly known as: Decadron Take 1 tablet (6 mg total) by mouth daily for 8 days. Start taking on: January 17, 2021   escitalopram 5 MG tablet Commonly known as: LEXAPRO Take 10 mg by mouth daily.   gabapentin 600 MG tablet Commonly known as: NEURONTIN Take 600 mg by mouth at bedtime.   guaiFENesin-dextromethorphan 100-10 MG/5ML  syrup Commonly known as: ROBITUSSIN DM Take 10 mLs by mouth every 4 (four) hours as needed for cough.   lisinopril 40 MG tablet Commonly known as: ZESTRIL Take 40 mg by mouth daily.   metFORMIN 500 MG 24 hr tablet Commonly known as: GLUCOPHAGE-XR Take 500 mg by mouth daily with breakfast.   metoprolol succinate 50 MG 24 hr tablet Commonly known as: TOPROL-XL Take 50 mg by mouth every evening.   omeprazole 20 MG capsule Commonly known as: PRILOSEC Take 20 mg by mouth daily.   OneTouch Delica Plus Lancet33G Misc Apply 1 each topically daily.   OneTouch Verio test strip Generic drug: glucose blood 1 each daily.   rosuvastatin 5 MG tablet Commonly known as: CRESTOR Take 5 mg by mouth once a week.   VITAMIN D PO Take 5,000 Int'l Units by mouth daily.            Durable Medical Equipment  (From admission, onward)         Start     Ordered   01/16/21 1233  For home use only DME oxygen  Once       Question Answer Comment  Length of Need 6 Months   Mode or (Route) Nasal cannula   Liters per Minute 2   Frequency Continuous (stationary and portable oxygen unit needed)   Oxygen delivery system Gas      01/16/21 1233          Follow-up Information    Johny Blamer, MD. Schedule an appointment as soon as possible for a visit in 1 week(s).   Specialty: Family Medicine Why: COVID-19 pneumonia Contact information: [address] [place] [address] [phone]              Allergies  Allergen Reactions  . Beconase Aq [Beclomethasone Diprop Monohyd] Shortness Of Breath  . Atenolol Other (See Comments)    Depression and crying   . Other     chloramycin eye drops - shortness of breath  . Cephalexin Rash  . Codeine Itching and Rash    Consultations:  None   Procedures/Studies: CT ANGIO CHEST PE W OR WO CONTRAST  Result Date: 01/15/2021 CLINICAL DATA:  Shortness of breath COVID positive EXAM: CT ANGIOGRAPHY CHEST WITH CONTRAST  TECHNIQUE: Multidetector CT imaging of the chest was performed using the standard protocol during bolus administration of intravenous contrast. Multiplanar CT image reconstructions and MIPs were obtained to evaluate the vascular anatomy. CONTRAST:  OMNIPAQUE IOHEXOL 350 MG/ML SOLN COMPARISON:  Radiograph same day FINDINGS: Cardiovascular: There is a optimal opacification of the pulmonary arteries. There is no central,segmental, or subsegmental filling defects within the pulmonary arteries. The heart is normal in size. No pericardial effusion or thickening. No evidence right heart strain. There is normal three-vessel brachiocephalic anatomy without proximal stenosis. The thoracic aorta is normal in appearance. Mediastinum/Nodes: Scattered prevascular, pretracheal, and subcarinal lymph nodes are seen. Within the right pretracheal space there is a 9 mm lymph node seen. Thyroid gland, trachea, and esophagus demonstrate no significant findings.  Lungs/Pleura: Extensive multifocal patchy airspace opacities are seen throughout both lungs, predominantly within both lower lungs. No pleural effusion or pneumothorax. Upper Abdomen: No acute abnormalities present in the visualized portions of the upper abdomen. Musculoskeletal: No chest wall abnormality. No acute or significant osseous findings. Review of the MIP images confirms the above findings. IMPRESSION: No central, segmental, or subsegmental pulmonary embolism Multifocal patchy airspace opacities throughout both lungs, predominantly within the lower lungs, consistent with COVID pneumonia Electronically Signed   By: Jonna Clark M.D.   On: 01/15/2021 03:10   DG Chest Port 1 View  Result Date: 01/14/2021 CLINICAL DATA:  COVID-19 positivity with shortness of breath EXAM: PORTABLE CHEST 1 VIEW COMPARISON:  08/27/2010 FINDINGS: The heart size and mediastinal contours are within normal limits. Both lungs are clear. The visualized skeletal structures are unremarkable.  IMPRESSION: No active disease. Electronically Signed   By: Alcide Clever M.D.   On: 01/14/2021 20:12      Subjective: Feels better. Dyspnea improved.  Discharge Exam: Vitals:   01/15/21 2043 01/16/21 0522  BP: (!) 119/58 104/65  Pulse: 85 67  Resp: (!) 24 17  Temp: 98.1 F (36.7 C) 97.8 F (36.6 C)  SpO2: 96% 96%   Vitals:   01/15/21 0930 01/15/21 1408 01/15/21 2043 01/16/21 0522  BP: 130/67 109/61 (!) 119/58 104/65  Pulse: 84 85 85 67  Resp: (!) 27 17 (!) 24 17  Temp:  98.8 F (37.1 C) 98.1 F (36.7 C) 97.8 F (36.6 C)  TempSrc:  Oral Oral Oral  SpO2: 98% 95% 96% 96%  Weight:      Height:        General: Pt is alert, awake, not in acute distress Cardiovascular: RRR, S1/S2 +, no rubs, no gallops Respiratory: CTA bilaterally, no wheezing, no rhonchi Abdominal: Soft, NT, ND, bowel sounds + Extremities: no edema, no cyanosis    The results of significant diagnostics from this hospitalization (including imaging, microbiology, ancillary and laboratory) are listed below for reference.     Microbiology: Recent Results (from the past 240 hour(s))  Blood Culture (routine x 2)     Status: None (Preliminary result)   Collection Time: 01/14/21 11:35 PM   Specimen: BLOOD  Result Value Ref Range Status   Specimen Description   Final    BLOOD SITE NOT SPECIFIED Performed at [facility], [address] [place], [place] [address]    Special Requests   Final    BOTTLES DRAWN AEROBIC AND ANAEROBIC Blood Culture adequate volume Performed at [facility], [address] [place], [place] [address]    Culture PENDING  Incomplete   Report Status PENDING  Incomplete  Blood Culture (routine x 2)     Status: None (Preliminary result)   Collection Time: 01/14/21 11:35 PM   Specimen: BLOOD  Result Value Ref Range Status   Specimen Description   Final    BLOOD SITE NOT SPECIFIED Performed at [facility], [address] [place], [place]  [address]    Special Requests   Final    BOTTLES DRAWN AEROBIC ONLY Blood Culture results may not be optimal due to an inadequate volume of blood received in culture bottles Performed at [facility], [address] [place], [place] [address]    Culture PENDING  Incomplete   Report Status PENDING  Incomplete     Labs: BNP (last 3 results) No results for input(s): BNP in the last 8760 hours. Basic Metabolic  Panel: Recent Labs  Lab 01/14/21 2335 01/16/21 0230  NA 137 136  K 3.5 3.9  CL 100 101  CO2 24 25  GLUCOSE 108* 193*  BUN 11 9  CREATININE 0.73 0.53  CALCIUM 8.8* 8.4*   Liver Function Tests: Recent Labs  Lab 01/14/21 2335 01/16/21 0230  AST 46* 38  ALT 44 37  ALKPHOS 51 48  BILITOT 1.1 0.7  PROT 7.9 6.6  ALBUMIN 4.4 3.6   No results for input(s): LIPASE, AMYLASE in the last 168 hours. No results for input(s): AMMONIA in the last 168 hours. CBC: Recent Labs  Lab 01/14/21 2335 01/16/21 0230 01/16/21 0827  WBC 3.1* 1.5* 2.2*  NEUTROABS 2.0 1.2* 1.6*  HGB 14.6 12.4 13.4  HCT 42.8 36.9 40.0  MCV 82.9 83.5 83.2  PLT 163 128* 167   Cardiac Enzymes: No results for input(s): CKTOTAL, CKMB, CKMBINDEX, TROPONINI in the last 168 hours. BNP: Invalid input(s): POCBNP CBG: Recent Labs  Lab 01/15/21 1406 01/15/21 1642 01/15/21 2045 01/16/21 0738 01/16/21 1132  GLUCAP 114* 94 215* 144* 170*   D-Dimer Recent Labs    01/14/21 2335 01/16/21 0230  DDIMER 0.95* 0.65*   Hgb A1c Recent Labs    01/15/21 0137  HGBA1C 5.3   Lipid Profile Recent Labs    01/14/21 2335  TRIG 151*   Thyroid function studies No results for input(s): TSH, T4TOTAL, T3FREE, THYROIDAB in the last 72 hours.  Invalid input(s): FREET3 Anemia work up [facility]    01/14/21 2336  FERRITIN 631*   Urinalysis No results found for: COLORURINE, APPEARANCEUR, LABSPEC, PHURINE, GLUCOSEU, HGBUR, BILIRUBINUR, KETONESUR, PROTEINUR, UROBILINOGEN, NITRITE,  LEUKOCYTESUR Sepsis Labs Invalid input(s): PROCALCITONIN,  WBC,  LACTICIDVEN Microbiology Recent Results (from the past 240 hour(s))  Blood Culture (routine x 2)     Status: None (Preliminary result)   Collection Time: 01/14/21 11:35 PM   Specimen: BLOOD  Result Value Ref Range Status   Specimen Description   Final    BLOOD SITE NOT SPECIFIED Performed at [facility], [address] [place], [place] [address]    Special Requests   Final    BOTTLES DRAWN AEROBIC AND ANAEROBIC Blood Culture adequate volume Performed at [facility], [address] [place], [place] [address]    Culture PENDING  Incomplete   Report Status PENDING  Incomplete  Blood Culture (routine x 2)     Status: None (Preliminary result)   Collection Time: 01/14/21 11:35 PM   Specimen: BLOOD  Result Value Ref Range Status   Specimen Description   Final    BLOOD SITE NOT SPECIFIED Performed at [facility], [address] [place], [place] [address]    Special Requests   Final    BOTTLES DRAWN AEROBIC ONLY Blood Culture results may not be optimal due to an inadequate volume of blood received in culture bottles Performed at [facility], [address] [place], [place] [address]    Culture PENDING  Incomplete   Report Status PENDING  Incomplete     Time coordinating discharge: 35 minutes  SIGNED:   Jacquelin Hawkingalph Brydon Spahr, MD [facility] 01/16/2021, 1:33 PM

## 2021-01-19 LAB — CULTURE, BLOOD (ROUTINE X 2): Special Requests: ADEQUATE

## 2021-01-20 LAB — CULTURE, BLOOD (ROUTINE X 2): Culture: NO GROWTH

## 2021-01-31 ENCOUNTER — Emergency Department (HOSPITAL_BASED_OUTPATIENT_CLINIC_OR_DEPARTMENT_OTHER): Payer: Managed Care, Other (non HMO)

## 2021-01-31 ENCOUNTER — Emergency Department (HOSPITAL_BASED_OUTPATIENT_CLINIC_OR_DEPARTMENT_OTHER)
Admission: EM | Admit: 2021-01-31 | Discharge: 2021-01-31 | Disposition: A | Discharge status: Home / Self Care | Payer: Managed Care, Other (non HMO) | Attending: Emergency Medicine | Admitting: Emergency Medicine

## 2021-01-31 ENCOUNTER — Other Ambulatory Visit: Payer: Self-pay

## 2021-01-31 ENCOUNTER — Encounter (HOSPITAL_BASED_OUTPATIENT_CLINIC_OR_DEPARTMENT_OTHER): Payer: Self-pay | Admitting: Emergency Medicine

## 2021-01-31 DIAGNOSIS — J45909 Unspecified asthma, uncomplicated: Secondary | ICD-10-CM | POA: Insufficient documentation

## 2021-01-31 DIAGNOSIS — Z7984 Long term (current) use of oral hypoglycemic drugs: Secondary | ICD-10-CM | POA: Insufficient documentation

## 2021-01-31 DIAGNOSIS — E119 Type 2 diabetes mellitus without complications: Secondary | ICD-10-CM | POA: Diagnosis not present

## 2021-01-31 DIAGNOSIS — I10 Essential (primary) hypertension: Secondary | ICD-10-CM | POA: Insufficient documentation

## 2021-01-31 DIAGNOSIS — R0789 Other chest pain: Secondary | ICD-10-CM | POA: Diagnosis not present

## 2021-01-31 DIAGNOSIS — R0602 Shortness of breath: Secondary | ICD-10-CM

## 2021-01-31 DIAGNOSIS — Z7982 Long term (current) use of aspirin: Secondary | ICD-10-CM | POA: Diagnosis not present

## 2021-01-31 DIAGNOSIS — R0600 Dyspnea, unspecified: Secondary | ICD-10-CM | POA: Diagnosis not present

## 2021-01-31 DIAGNOSIS — Z79899 Other long term (current) drug therapy: Secondary | ICD-10-CM | POA: Insufficient documentation

## 2021-01-31 DIAGNOSIS — U071 COVID-19: Secondary | ICD-10-CM | POA: Diagnosis not present

## 2021-01-31 LAB — CBC WITH DIFFERENTIAL/PLATELET
Abs Immature Granulocytes: 0.03 10*3/uL (ref 0.00–0.07)
Basophils Absolute: 0 10*3/uL (ref 0.0–0.1)
Basophils Relative: 1 %
Eosinophils Absolute: 0.3 10*3/uL (ref 0.0–0.5)
Eosinophils Relative: 4 %
HCT: 40.5 % (ref 36.0–46.0)
Hemoglobin: 14 g/dL (ref 12.0–15.0)
Immature Granulocytes: 0 %
Lymphocytes Relative: 20 %
Lymphs Abs: 1.6 10*3/uL (ref 0.7–4.0)
MCH: 28.6 pg (ref 26.0–34.0)
MCHC: 34.6 g/dL (ref 30.0–36.0)
MCV: 82.7 fL (ref 80.0–100.0)
Monocytes Absolute: 0.7 10*3/uL (ref 0.1–1.0)
Monocytes Relative: 8 %
Neutro Abs: 5.4 10*3/uL (ref 1.7–7.7)
Neutrophils Relative %: 67 %
Platelets: 175 10*3/uL (ref 150–400)
RBC: 4.9 MIL/uL (ref 3.87–5.11)
RDW: 14.9 % (ref 11.5–15.5)
WBC: 8 10*3/uL (ref 4.0–10.5)
nRBC: 0 % (ref 0.0–0.2)

## 2021-01-31 LAB — BASIC METABOLIC PANEL
Anion gap: 11 (ref 5–15)
BUN: 11 mg/dL (ref 6–20)
CO2: 22 mmol/L (ref 22–32)
Calcium: 9.3 mg/dL (ref 8.9–10.3)
Chloride: 106 mmol/L (ref 98–111)
Creatinine, Ser: 0.47 mg/dL (ref 0.44–1.00)
GFR, Estimated: >60 mL/min (ref >60)
Glucose, Bld: 105 mg/dL — ABNORMAL HIGH (ref 70–99)
Potassium: 3.8 mmol/L (ref 3.5–5.1)
Sodium: 139 mmol/L (ref 135–145)

## 2021-01-31 LAB — TROPONIN I (HIGH SENSITIVITY): Troponin I (High Sensitivity): 4 ng/L (ref <18)

## 2021-01-31 LAB — BRAIN NATRIURETIC PEPTIDE: B Natriuretic Peptide: 21.3 pg/mL (ref 0.0–100.0)

## 2021-01-31 MED ORDER — IOHEXOL 350 MG/ML SOLN
100.0000 mL | Freq: Once | INTRAVENOUS | Status: AC | PRN
  Administered 2021-01-31: 75 mL via INTRAVENOUS

## 2021-01-31 NOTE — ED Provider Notes (Signed)
[facility] EMERGENCY DEPARTMENT Provider Note   CSN: [id number] Arrival date & time: 01/31/21  1130     History Chief Complaint  Patient presents with  . Shortness of Breath    Norma [name] is a 60 y.o. female history of diabetes, obesity, recent hospitalization for COVID pneumonia in January 2022 (onset of symptoms 01/03/21), presented to emergency department with shortness of breath.  The patient was seen in urgent care today for shortness of breath and right-sided rib pain.  She thinks he may have cracked the right lower rib while coughing over the weekend.  There was concern about her work of breathing at the urgent care and she was told to come to the emergency department.  She says she has been breathing like this since she left the hospital with Covid.  She did leave the hospital on supplemental oxygen, but has been weaning off of that and does not require typically at home.  She says she is short of breath all the time, but her breathing improves when she slows down and takes a few deep breaths.  She supposed to wear CPAP at night but hasn't been because of her coughing.  She denies any fevers or chills.  She reports she finished a course of steroids 5 days ago.  She has not been on antibiotics.  He has no history of DVT or PE.  She did have a PE study performed on January 21 during the initial diagnosis which not show any large clots but did note extensive multifocal patchy opacities in the bilateral lungs consistent with Covid disease.  HPI     Past Medical History:  Diagnosis Date  . Arthritis    hips  . Asthma   . Costochondritis   . Diabetes (HCC)   . GERD (gastroesophageal reflux disease)   . Headache   . HTN (hypertension)   . Hyperlipidemia   . Obesity   . Palpitations   . Paresthesias    on Neurontin  . Pinched nerve in neck    pt thinks it's between C3 and C4  . PONV (postoperative nausea and vomiting)   . PVC (premature ventricular contraction)    . Sleep apnea    on CPAP; now moderate.     Patient Active Problem List   Diagnosis Date Noted  . Pneumonia due to COVID-19 virus 01/16/2021  . DM2 (diabetes mellitus, type 2) (HCC) 01/15/2021  . HTN (hypertension) 01/15/2021  . COVID-19 virus infection 01/15/2021  . Imbalance 05/13/2020  . PVC (premature ventricular contraction) 12/07/2011  . URI (upper respiratory infection) 12/07/2011  . Mixed hyperlipidemia 12/07/2011  . Dizzy 12/07/2011    Past Surgical History:  Procedure Laterality Date  . BREAST MASS EXCISION Left   . CESAREAN SECTION    . CHOLECYSTECTOMY N/A 09/23/2016   Procedure: LAPAROSCOPIC CHOLECYSTECTOMY WITH INTRAOPERATIVE CHOLANGIOGRAM;  Surgeon: Luretha Murphy, MD;  Location: [facility];  Service: General;  Laterality: N/A;  . EYE SURGERY     to correct cross sidedness   . novasure procedure     . RADIOACTIVE SEED GUIDED EXCISIONAL BREAST BIOPSY Left 07/20/2018   Procedure: RADIOACTIVE SEED GUIDED EXCISIONAL BREAST BIOPSY;  Surgeon: Luretha Murphy, MD;  Location: [facility];  Service: General;  Laterality: Left;  . TUBAL LIGATION       OB History   No obstetric history on file.     Family History  Problem Relation Age of Onset  . Other Mother  benign brain tumor   . Stroke Father   . Heart attack Father   . Multiple sclerosis Niece     Social History   Tobacco Use  . Smoking status: Never Smoker  . Smokeless tobacco: Never Used  Vaping Use  . Vaping Use: Never used  Substance Use Topics  . Alcohol use: No  . Drug use: No    Home Medications Prior to Admission medications   Medication Sig Start Date End Date Taking? Authorizing Provider  albuterol (PROVENTIL HFA;VENTOLIN HFA) 108 (90 Base) MCG/ACT inhaler Inhale 2 puffs into the lungs every 6 (six) hours as needed for wheezing or shortness of breath.    [provider]  aspirin 81 MG tablet Take 81 mg by mouth daily.    [provider]  CINNAMON PO Take  1,000 mg by mouth daily.    [provider]  cyclobenzaprine (FLEXERIL) 5 MG tablet Take 5 mg by mouth at bedtime as needed for muscle spasms.    [provider]  escitalopram (LEXAPRO) 5 MG tablet Take 10 mg by mouth daily.  03/10/20   [provider]  gabapentin (NEURONTIN) 600 MG tablet Take 600 mg by mouth at bedtime.    [provider]  guaiFENesin-dextromethorphan (ROBITUSSIN DM) 100-10 MG/5ML syrup Take 10 mLs by mouth every 4 (four) hours as needed for cough. 01/16/21   Narda Bonds, MD  Lancets [facility] DELICA PLUS [place]) MISC Apply 1 each topically daily. 03/10/20   [provider]  lisinopril (PRINIVIL,ZESTRIL) 40 MG tablet Take 40 mg by mouth daily.    [provider]  metFORMIN (GLUCOPHAGE-XR) 500 MG 24 hr tablet Take 500 mg by mouth daily with breakfast. 03/10/20   [provider]  metoprolol succinate (TOPROL-XL) 50 MG 24 hr tablet Take 50 mg by mouth every evening.  08/08/16   [provider]  omeprazole (PRILOSEC) 20 MG capsule Take 20 mg by mouth daily. 03/31/20   [provider]  [facility] VERIO test strip 1 each daily. 03/10/20   [provider]  rosuvastatin (CRESTOR) 5 MG tablet Take 5 mg by mouth once a week.  04/07/20   [provider]  VITAMIN D PO Take 5,000 Int'l Units by mouth daily.    [provider]    Allergies    Beconase aq [beclomethasone diprop monohyd], Atenolol, Other, Cephalexin, and Codeine  Review of Systems   Review of Systems  Constitutional: Negative for chills and fever.  HENT: Negative for ear pain and sore throat.   Eyes: Negative for pain and visual disturbance.  Respiratory: Positive for shortness of breath. Negative for cough.   Cardiovascular: Positive for chest pain. Negative for palpitations.  Gastrointestinal: Negative for abdominal pain and vomiting.  Musculoskeletal: Positive for arthralgias and myalgias.  Skin: Negative for color  change and rash.  Neurological: Negative for seizures and syncope.  All other systems reviewed and are negative.   Physical Exam Updated Vital Signs BP 132/74   Pulse 62   Temp 99 F (37.2 C) (Oral)   Resp (!) 26   Ht 5\' 2"  (1.575 m)   Wt 89.8 kg   LMP  (LMP Unknown)   SpO2 98%   BMI 36.21 kg/m   Physical Exam Constitutional:      General: She is not in acute distress. HENT:     Head: Normocephalic and atraumatic.  Eyes:     Conjunctiva/sclera: Conjunctivae normal.     Pupils: Pupils are equal, round, and  reactive to light.  Cardiovascular:     Rate and Rhythm: Normal rate and regular rhythm.  Pulmonary:     Effort: Pulmonary effort is normal. No respiratory distress.     Comments: 96% on room air Abdominal:     General: There is no distension.     Tenderness: There is no abdominal tenderness.  Skin:    General: Skin is warm and dry.  Neurological:     General: No focal deficit present.     Mental Status: She is alert. Mental status is at baseline.  Psychiatric:        Mood and Affect: Mood normal.        Behavior: Behavior normal.     ED Results / Procedures / Treatments   Labs (all labs ordered are listed, but only abnormal results are displayed) Labs Reviewed  BASIC METABOLIC PANEL - Abnormal; Notable for the following components:      Result Value   Glucose, Bld 105 (*)    All other components within normal limits  CBC WITH DIFFERENTIAL/PLATELET  BRAIN NATRIURETIC PEPTIDE  TROPONIN I (HIGH SENSITIVITY)    EKG EKG Interpretation  Date/Time:  Sunday January 31 2021 11:44:53 EST Ventricular Rate:  66 PR Interval:    QRS Duration: 88 QT Interval:  397 QTC Calculation: 416 R Axis:   -2 Text Interpretation: Sinus rhythm No STEMI Confirmed by Alvester Chou [phone]) on 01/31/2021 11:56:17 AM   Radiology CT Angio Chest PE W and/or Wo Contrast  Result Date: 01/31/2021 CLINICAL DATA:  Worsening dyspnea. COVID-19 diagnosis on 01/11 EXAM: CT  ANGIOGRAPHY CHEST WITH CONTRAST TECHNIQUE: Multidetector CT imaging of the chest was performed using the standard protocol during bolus administration of intravenous contrast. Multiplanar CT image reconstructions and MIPs were obtained to evaluate the vascular anatomy. CONTRAST:  69mL OMNIPAQUE IOHEXOL 350 MG/ML SOLN COMPARISON:  January 15, 2021 FINDINGS: Cardiovascular: Satisfactory opacification of the pulmonary arteries to the segmental level. No evidence of pulmonary embolism. Normal heart size. No pericardial effusion. Mediastinum/Nodes: No enlarged mediastinal, hilar, or axillary lymph nodes. Thyroid gland, trachea, and esophagus demonstrate no significant findings. Lungs/Pleura: Residual ground-glass opacities with peripheral predominance in bilateral lungs. Overall aeration has improved when compared to January 15, 2021. Upper Abdomen: No acute abnormality. Musculoskeletal: No chest wall abnormality. No acute or significant osseous findings. Review of the MIP images confirms the above findings. IMPRESSION: 1. No evidence of pulmonary embolus. 2. Residual ground-glass opacities with peripheral predominance in bilateral lungs. Overall aeration has improved when compared to January 15, 2021. Findings are most consistent with resolving COVID-19 pneumonia. Electronically Signed   By: Ted Mcalpine M.D.   On: 01/31/2021 14:17   DG Chest Portable 1 View  Result Date: 01/31/2021 CLINICAL DATA:  Shortness of breath EXAM: PORTABLE CHEST 1 VIEW COMPARISON:  01/14/2021 FINDINGS: Mild residual interstitial thickening. No focal consolidation. No pleural effusion or pneumothorax. Heart and mediastinal contours are unremarkable. No acute osseous abnormality. IMPRESSION: Mild residual interstitial thickening which may reflect persistent infection or chronic changes. Otherwise no acute cardiopulmonary disease. Electronically Signed   By: Elige Ko   On: 01/31/2021 12:19    Procedures Procedures    Medications Ordered in ED Medications  iohexol (OMNIPAQUE) 350 MG/ML injection 100 mL (75 mLs Intravenous Contrast Given 01/31/21 1353)    ED Course  I have reviewed the triage vital signs and the nursing notes.  Pertinent labs & imaging results that were available during my care of the patient were reviewed by me  and considered in my medical decision making (see chart for details).  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  Doubt this is referred pain from abdomen; benign abd exam.  Okay for d/c.  Will provide pulmonary clinic information to establish care for f/u for chronic dyspnea related to covid.    Final Clinical Impression(s) / ED Diagnoses Final diagnoses:  Shortness of breath  Chest wall pain    Rx / DC Orders ED Discharge Orders    None       Terald Sleeper, MD 01/31/21 [phone]

## 2021-01-31 NOTE — ED Triage Notes (Signed)
Diagnosed with covid 1/11. Reports ongoing SOB with exertion. Also c/o R rib pain. States she is concerned she cracked a rib while coughing. She was d/c for [facility] on 1/22 following an admission for covid pneumonia. Pt ambulated to room 11, O2 sats 97% on room air.

## 2021-01-31 NOTE — Discharge Instructions (Addendum)
You can take motrin (ibuprofen) 600 mg every 6 hours for the next 7 days as needed for pain.  You can continue taking tylenol as well.

## 2021-02-03 ENCOUNTER — Encounter: Payer: Self-pay | Admitting: Pulmonary Disease

## 2021-02-04 ENCOUNTER — Encounter (HOSPITAL_BASED_OUTPATIENT_CLINIC_OR_DEPARTMENT_OTHER): Payer: Self-pay | Admitting: Emergency Medicine

## 2021-02-04 ENCOUNTER — Other Ambulatory Visit: Payer: Self-pay

## 2021-02-04 ENCOUNTER — Emergency Department (HOSPITAL_BASED_OUTPATIENT_CLINIC_OR_DEPARTMENT_OTHER)
Admission: EM | Admit: 2021-02-04 | Discharge: 2021-02-04 | Disposition: A | Discharge status: Home / Self Care | Payer: Managed Care, Other (non HMO) | Attending: Emergency Medicine | Admitting: Emergency Medicine

## 2021-02-04 DIAGNOSIS — E119 Type 2 diabetes mellitus without complications: Secondary | ICD-10-CM | POA: Diagnosis not present

## 2021-02-04 DIAGNOSIS — Z7984 Long term (current) use of oral hypoglycemic drugs: Secondary | ICD-10-CM | POA: Diagnosis not present

## 2021-02-04 DIAGNOSIS — Z8616 Personal history of COVID-19: Secondary | ICD-10-CM | POA: Insufficient documentation

## 2021-02-04 DIAGNOSIS — R519 Headache, unspecified: Secondary | ICD-10-CM | POA: Diagnosis present

## 2021-02-04 DIAGNOSIS — I10 Essential (primary) hypertension: Secondary | ICD-10-CM | POA: Diagnosis not present

## 2021-02-04 DIAGNOSIS — Z7982 Long term (current) use of aspirin: Secondary | ICD-10-CM | POA: Insufficient documentation

## 2021-02-04 DIAGNOSIS — J45909 Unspecified asthma, uncomplicated: Secondary | ICD-10-CM | POA: Diagnosis not present

## 2021-02-04 DIAGNOSIS — Z79899 Other long term (current) drug therapy: Secondary | ICD-10-CM | POA: Diagnosis not present

## 2021-02-04 LAB — BASIC METABOLIC PANEL
Anion gap: 10 (ref 5–15)
BUN: 14 mg/dL (ref 6–20)
CO2: 23 mmol/L (ref 22–32)
Calcium: 9 mg/dL (ref 8.9–10.3)
Chloride: 106 mmol/L (ref 98–111)
Creatinine, Ser: 0.5 mg/dL (ref 0.44–1.00)
GFR, Estimated: >60 mL/min (ref >60)
Glucose, Bld: 152 mg/dL — ABNORMAL HIGH (ref 70–99)
Potassium: 3.7 mmol/L (ref 3.5–5.1)
Sodium: 139 mmol/L (ref 135–145)

## 2021-02-04 LAB — CBC
HCT: 38 % (ref 36.0–46.0)
Hemoglobin: 13 g/dL (ref 12.0–15.0)
MCH: 28.4 pg (ref 26.0–34.0)
MCHC: 34.2 g/dL (ref 30.0–36.0)
MCV: 83 fL (ref 80.0–100.0)
Platelets: 152 10*3/uL (ref 150–400)
RBC: 4.58 MIL/uL (ref 3.87–5.11)
RDW: 14.9 % (ref 11.5–15.5)
WBC: 5.8 10*3/uL (ref 4.0–10.5)
nRBC: 0 % (ref 0.0–0.2)

## 2021-02-04 MED ORDER — ACETAMINOPHEN 500 MG PO TABS
1000.0000 mg | ORAL_TABLET | Freq: Once | ORAL | Status: AC
  Administered 2021-02-04: 1000 mg via ORAL
  Filled 2021-02-04: qty 2

## 2021-02-04 MED ORDER — LISINOPRIL 10 MG PO TABS
40.0000 mg | ORAL_TABLET | Freq: Once | ORAL | Status: AC
  Administered 2021-02-04: 40 mg via ORAL
  Filled 2021-02-04: qty 4

## 2021-02-04 MED ORDER — METOPROLOL SUCCINATE ER 25 MG PO TB24
50.0000 mg | ORAL_TABLET | Freq: Every day | ORAL | Status: DC
  Filled 2021-02-04: qty 2

## 2021-02-04 NOTE — ED Notes (Signed)
Pt ambulated to restroom with steady gait, VSS, GCS 15, NAD noted.

## 2021-02-04 NOTE — ED Provider Notes (Signed)
[facility] EMERGENCY DEPARTMENT Provider Note   CSN: [id number] Arrival date & time: 02/04/21  0539     History Chief Complaint  Patient presents with  . Hypertension    Norma [name] is a 60 y.o. female.  Patient presents to the emergency department with complaints of elevated blood pressure.  Patient reports that she woke this morning and had a headache.  Patient reports throbbing pain across her forehead.  No vision change.  No chest pain.  Patient took her blood pressure and it was elevated.  She did this because it was slightly elevated at her doctor's office yesterday.  No medication changes were made.  Patient reports before she left the house it was 179/99.        Past Medical History:  Diagnosis Date  . Arthritis    hips  . Asthma   . Costochondritis   . Diabetes (HCC)   . GERD (gastroesophageal reflux disease)   . Headache   . HTN (hypertension)   . Hyperlipidemia   . Obesity   . Palpitations   . Paresthesias    on Neurontin  . Pinched nerve in neck    pt thinks it's between C3 and C4  . PONV (postoperative nausea and vomiting)   . PVC (premature ventricular contraction)   . Sleep apnea    on CPAP; now moderate.     Patient Active Problem List   Diagnosis Date Noted  . Pneumonia due to COVID-19 virus 01/16/2021  . DM2 (diabetes mellitus, type 2) (HCC) 01/15/2021  . HTN (hypertension) 01/15/2021  . COVID-19 virus infection 01/15/2021  . Imbalance 05/13/2020  . PVC (premature ventricular contraction) 12/07/2011  . URI (upper respiratory infection) 12/07/2011  . Mixed hyperlipidemia 12/07/2011  . Dizzy 12/07/2011    Past Surgical History:  Procedure Laterality Date  . BREAST MASS EXCISION Left   . CESAREAN SECTION    . CHOLECYSTECTOMY N/A 09/23/2016   Procedure: LAPAROSCOPIC CHOLECYSTECTOMY WITH INTRAOPERATIVE CHOLANGIOGRAM;  Surgeon: Luretha Murphy, MD;  Location: [facility];  Service: General;  Laterality: N/A;  . EYE SURGERY     to  correct cross sidedness   . novasure procedure     . RADIOACTIVE SEED GUIDED EXCISIONAL BREAST BIOPSY Left 07/20/2018   Procedure: RADIOACTIVE SEED GUIDED EXCISIONAL BREAST BIOPSY;  Surgeon: Luretha Murphy, MD;  Location: [facility];  Service: General;  Laterality: Left;  . TUBAL LIGATION       OB History   No obstetric history on file.     Family History  Problem Relation Age of Onset  . Other Mother        benign brain tumor   . Stroke Father   . Heart attack Father   . Multiple sclerosis Niece     Social History   Tobacco Use  . Smoking status: Never Smoker  . Smokeless tobacco: Never Used  Vaping Use  . Vaping Use: Never used  Substance Use Topics  . Alcohol use: No  . Drug use: No    Home Medications Prior to Admission medications   Medication Sig Start Date End Date Taking? Authorizing Provider  albuterol (PROVENTIL HFA;VENTOLIN HFA) 108 (90 Base) MCG/ACT inhaler Inhale 2 puffs into the lungs every 6 (six) hours as needed for wheezing or shortness of breath.    [provider]  aspirin 81 MG tablet Take 81 mg by mouth daily.    [provider]  CINNAMON PO Take 1,000 mg by mouth daily.  [provider]  cyclobenzaprine (FLEXERIL) 5 MG tablet Take 5 mg by mouth at bedtime as needed for muscle spasms.    [provider]  escitalopram (LEXAPRO) 5 MG tablet Take 10 mg by mouth daily.  03/10/20   [provider]  gabapentin (NEURONTIN) 600 MG tablet Take 600 mg by mouth at bedtime.    [provider]  guaiFENesin-dextromethorphan (ROBITUSSIN DM) 100-10 MG/5ML syrup Take 10 mLs by mouth every 4 (four) hours as needed for cough. 01/16/21   Narda Bonds, MD  Lancets [facility] DELICA PLUS [place]) MISC Apply 1 each topically daily. 03/10/20   [provider]  lisinopril (PRINIVIL,ZESTRIL) 40 MG tablet Take 40 mg by mouth daily.    [provider]  metFORMIN (GLUCOPHAGE-XR) 500 MG 24 hr  tablet Take 500 mg by mouth daily with breakfast. 03/10/20   [provider]  metoprolol succinate (TOPROL-XL) 50 MG 24 hr tablet Take 50 mg by mouth every evening.  08/08/16   [provider]  omeprazole (PRILOSEC) 20 MG capsule Take 20 mg by mouth daily. 03/31/20   [provider]  [facility] VERIO test strip 1 each daily. 03/10/20   [provider]  rosuvastatin (CRESTOR) 5 MG tablet Take 5 mg by mouth once a week.  04/07/20   [provider]  VITAMIN D PO Take 5,000 Int'l Units by mouth daily.    [provider]    Allergies    Beconase aq [beclomethasone diprop monohyd], Atenolol, Other, Cephalexin, and Codeine  Review of Systems   Review of Systems  Neurological: Positive for headaches.  All other systems reviewed and are negative.   Physical Exam Updated Vital Signs BP (!) 159/87   Pulse 71   Temp 98.3 F (36.8 C) (Oral)   Resp (!) 22   Ht 5\' 2"  (1.575 m)   Wt 89.8 kg   LMP  (LMP Unknown)   SpO2 98%   BMI 36.21 kg/m   Physical Exam Vitals and nursing note reviewed.  Constitutional:      General: She is not in acute distress.    Appearance: Normal appearance. She is well-developed and well-nourished.  HENT:     Head: Normocephalic and atraumatic.     Right Ear: Hearing normal.     Left Ear: Hearing normal.     Nose: Nose normal.     Mouth/Throat:     Mouth: Oropharynx is clear and moist and mucous membranes are normal.  Eyes:     Extraocular Movements: EOM normal.     Conjunctiva/sclera: Conjunctivae normal.     Pupils: Pupils are equal, round, and reactive to light.  Cardiovascular:     Rate and Rhythm: Regular rhythm.     Heart sounds: S1 normal and S2 normal. No murmur heard. No friction rub. No gallop.   Pulmonary:     Effort: Pulmonary effort is normal. No respiratory distress.     Breath sounds: Normal breath sounds.  Chest:     Chest wall: No tenderness.  Abdominal:     General: Bowel sounds are  normal.     Palpations: Abdomen is soft. There is no hepatosplenomegaly.     Tenderness: There is no abdominal tenderness. There is no guarding or rebound. Negative signs include Murphy's sign and McBurney's sign.     Hernia: No hernia is present.  Musculoskeletal:        General: Normal range of motion.     Cervical back: Normal range of motion and  neck supple.  Skin:    General: Skin is warm, dry and intact.     Findings: No rash.     Nails: There is no cyanosis.  Neurological:     Mental Status: She is alert and oriented to person, place, and time.     GCS: GCS eye subscore is 4. GCS verbal subscore is 5. GCS motor subscore is 6.     Cranial Nerves: No cranial nerve deficit.     Sensory: No sensory deficit.     Coordination: Coordination normal.     Deep Tendon Reflexes: Strength normal.  Psychiatric:        Mood and Affect: Mood and affect normal.        Speech: Speech normal.        Behavior: Behavior normal.        Thought Content: Thought content normal.     ED Results / Procedures / Treatments   Labs (all labs ordered are listed, but only abnormal results are displayed) Labs Reviewed  BASIC METABOLIC PANEL - Abnormal; Notable for the following components:      Result Value   Glucose, Bld 152 (*)    All other components within normal limits    EKG EKG Interpretation  Date/Time:  Thursday February 04 2021 06:08:10 EST Ventricular Rate:  66 PR Interval:    QRS Duration: 95 QT Interval:  398 QTC Calculation: 417 R Axis:   -15 Text Interpretation: Sinus rhythm Borderline left axis deviation No significant change since last tracing Confirmed by Gilda Crease [phone]) on 02/04/2021 6:10:06 AM   Radiology No results found.  Procedures Procedures   Medications Ordered in ED Medications  metoprolol succinate (TOPROL-XL) 24 hr tablet 50 mg (has no administration in time range)  lisinopril (ZESTRIL) tablet 40 mg (40 mg Oral Given 02/04/21 0618)   acetaminophen (TYLENOL) tablet 1,000 mg (1,000 mg Oral Given 02/04/21 3382)    ED Course  I have reviewed the triage vital signs and the nursing notes.  Pertinent labs & imaging results that were available during my care of the patient were reviewed by me and considered in my medical decision making (see chart for details).    MDM Rules/Calculators/A&P                          Presents with headache and elevated blood pressure.  Patient reports that her blood pressure was 179/99 at home.  Upon triage here in the ED is 159/87.  She reports her headache is better but not completely resolved.  She has no neurologic findings.  Doubt intracranial bleed as she has significantly improved, will treat her blood pressure and monitor.  Final Clinical Impression(s) / ED Diagnoses Final diagnoses:  Primary hypertension    Rx / DC Orders ED Discharge Orders    None       Elian Gloster, Canary Brim, MD 02/04/21 [phone]

## 2021-02-04 NOTE — ED Triage Notes (Signed)
Pt reports awaking with headache and elevated BP. Pt has not taken morning does of medication.

## 2021-02-16 ENCOUNTER — Encounter: Payer: Self-pay | Admitting: Pulmonary Disease

## 2021-02-16 ENCOUNTER — Other Ambulatory Visit: Payer: Self-pay

## 2021-02-16 ENCOUNTER — Ambulatory Visit: Payer: Managed Care, Other (non HMO) | Admitting: Pulmonary Disease

## 2021-02-16 VITALS — BP 124/86 | HR 75 | Temp 97.7°F | Ht 62.0 in | Wt 197.0 lb

## 2021-02-16 DIAGNOSIS — R059 Cough, unspecified: Secondary | ICD-10-CM

## 2021-02-16 DIAGNOSIS — G4733 Obstructive sleep apnea (adult) (pediatric): Secondary | ICD-10-CM

## 2021-02-16 DIAGNOSIS — J1282 Pneumonia due to coronavirus disease 2019: Secondary | ICD-10-CM

## 2021-02-16 DIAGNOSIS — U071 COVID-19: Secondary | ICD-10-CM

## 2021-02-16 DIAGNOSIS — R0982 Postnasal drip: Secondary | ICD-10-CM | POA: Diagnosis not present

## 2021-02-16 MED ORDER — IPRATROPIUM BROMIDE 0.03 % NA SOLN: 2.0000 | Freq: Two times a day (BID) | NASAL | 12 refills | Status: DC

## 2021-02-16 MED ORDER — FLOVENT HFA 110 MCG/ACT IN AERO: 2.0000 | INHALATION_SPRAY | Freq: Two times a day (BID) | RESPIRATORY_TRACT | 12 refills | Status: DC

## 2021-02-16 NOTE — Progress Notes (Signed)
Synopsis: Referred in February 2022 by Dr. Alvester ChouMatthew Trifan for shortness of breath  Subjective:   PATIENT ID: Norma DeisPatricia E [name] GENDER: female DOB: 07/18/1961, MRN: [id number]   HPI  Chief Complaint  Patient presents with  . Consult    Referred from ED for COVID PNA. Had covid back in January 2022. States she has been feeling ok since being home. Still has a productive cough and DOE. Was discharged on 2L of O2. Does not use O2 during the day now but still uses 1L at night.   Norma [name] is a 60 year old woman, never smoker with hypertension, obstructive sleep apnea on CPAP and diabetes mellitus type II who is referred to pulmonary clinic for shortness of breath after Covid 19 pneumonia.    She was admitted 1/21 to 1/22 at [facility] where she was provided 10 days of steroids and discharged with 2L supplemental oxygen. She had CTA chest at the time of admission which was negative for pulmonary emboli and showed diffuse bilateral ground glass infiltrates consistent with covid 19 pneumonia. She had repeat CTA chest on 01/31/21 after presenting to the ER with worsening shortness of breath, again no pulmonary emboli. She had a subsequent visit to the ER on 02/04/21 due to hypertension.   She has not been wearing her CPAP due to cough and sinus congestion. She does have post-nasal drainage. She does not find much relief from the albuterol inhaler.   She takes prilosec for GERD. She has been on lisinopril for many years.          Past Medical History:  Diagnosis Date  . Arthritis    hips  . Asthma   . Costochondritis   . Diabetes (HCC)   . GERD (gastroesophageal reflux disease)   . Headache   . HTN (hypertension)   . Hyperlipidemia   . Obesity   . Palpitations   . Paresthesias    on Neurontin  . Pinched nerve in neck    pt thinks it's between C3 and C4  . PONV (postoperative nausea and vomiting)   . PVC (premature ventricular contraction)   . Sleep apnea    on CPAP; now  moderate.      Family History  Problem Relation Age of Onset  . Other Mother        benign brain tumor   . Stroke Father   . Heart attack Father   . Multiple sclerosis Niece      Social History   Socioeconomic History  . Marital status: Married    Spouse name: Not on file  . Number of children: 4  . Years of education: Not on file  . Highest education level: Bachelor's degree (e.g., BA, AB, BS)  Occupational History  . Not on file  Tobacco Use  . Smoking status: Never Smoker  . Smokeless tobacco: Never Used  Vaping Use  . Vaping Use: Never used  Substance and Sexual Activity  . Alcohol use: No  . Drug use: No  . Sexual activity: Not Currently    Birth control/protection: Post-menopausal  Other Topics Concern  . Not on file  Social History Narrative   Lives at home with husband    Right handed   Caffeine: none    Social Determinants of Health   Financial Resource Strain: Not on file  Food Insecurity: Not on file  Transportation Needs: Not on file  Physical Activity: Not on file  Stress: Not on file  Social Connections:  Not on file  Intimate Partner Violence: Not on file     Allergies  Allergen Reactions  . Beconase Aq [Beclomethasone Diprop Monohyd] Shortness Of Breath  . Atenolol Other (See Comments)    Depression and crying   . Other     chloramycin eye drops - shortness of breath  . Cephalexin Rash  . Codeine Itching and Rash     Outpatient Medications Prior to Visit  Medication Sig Dispense Refill  . albuterol (PROVENTIL HFA;VENTOLIN HFA) 108 (90 Base) MCG/ACT inhaler Inhale 2 puffs into the lungs every 6 (six) hours as needed for wheezing or shortness of breath.    Marland Kitchen amLODipine (NORVASC) 5 MG tablet Take 5 mg by mouth daily.    Marland Kitchen aspirin 81 MG tablet Take 81 mg by mouth daily.    Marland Kitchen CINNAMON PO Take 1,000 mg by mouth daily.    . cyclobenzaprine (FLEXERIL) 5 MG tablet Take 5 mg by mouth at bedtime as needed for muscle spasms.    Marland Kitchen escitalopram  (LEXAPRO) 5 MG tablet Take 10 mg by mouth daily.     . fluticasone (FLONASE) 50 MCG/ACT nasal spray Place 1 spray into both nostrils 2 (two) times daily.    Marland Kitchen gabapentin (NEURONTIN) 600 MG tablet Take 600 mg by mouth at bedtime.    Marland Kitchen guaiFENesin-dextromethorphan (ROBITUSSIN DM) 100-10 MG/5ML syrup Take 10 mLs by mouth every 4 (four) hours as needed for cough. 118 mL 0  . Lancets (ONETOUCH DELICA PLUS LANCET33G) MISC Apply 1 each topically daily.    Marland Kitchen lisinopril (PRINIVIL,ZESTRIL) 40 MG tablet Take 40 mg by mouth daily.    . metFORMIN (GLUCOPHAGE-XR) 500 MG 24 hr tablet Take 500 mg by mouth daily with breakfast.    . metoprolol succinate (TOPROL-XL) 50 MG 24 hr tablet Take 50 mg by mouth every evening.     Marland Kitchen omeprazole (PRILOSEC) 20 MG capsule Take 20 mg by mouth daily.    Letta Pate VERIO test strip 1 each daily.    . rosuvastatin (CRESTOR) 5 MG tablet Take 5 mg by mouth once a week.     Marland Kitchen VITAMIN D PO Take 5,000 Int'l Units by mouth daily.    . Zinc 50 MG TABS 1 tablet     No facility-administered medications prior to visit.    Review of Systems  Constitutional: Negative for chills, fever, malaise/fatigue and weight loss.  HENT: Positive for congestion and sore throat. Negative for sinus pain.   Eyes: Negative.   Respiratory: Positive for cough and shortness of breath. Negative for hemoptysis, sputum production and wheezing.   Cardiovascular: Positive for palpitations. Negative for chest pain, orthopnea, claudication and leg swelling.  Gastrointestinal: Negative for abdominal pain, heartburn, nausea and vomiting.  Genitourinary: Negative.   Musculoskeletal: Negative for joint pain and myalgias.  Skin: Negative for rash.  Neurological: Positive for headaches. Negative for weakness.  Endo/Heme/Allergies: Negative.   Psychiatric/Behavioral: The patient is nervous/anxious.    Objective:   Vitals:   02/16/21 1140  BP: 124/86  Pulse: 75  Temp: 97.7 F (36.5 C)  TempSrc: Temporal   SpO2: 97%  Weight: 197 lb (89.4 kg)  Height: 5\' 2"  (1.575 m)     Physical Exam Constitutional:      General: She is not in acute distress.    Appearance: She is obese. She is not ill-appearing.  HENT:     Head: Normocephalic and atraumatic.  Eyes:     General: No scleral icterus.    Conjunctiva/sclera: Conjunctivae  normal.     Pupils: Pupils are equal, round, and reactive to light.  Cardiovascular:     Rate and Rhythm: Normal rate and regular rhythm.     Pulses: Normal pulses.     Heart sounds: Normal heart sounds. No murmur heard.   Pulmonary:     Effort: Pulmonary effort is normal.     Breath sounds: Normal breath sounds. No wheezing, rhonchi or rales.  Abdominal:     General: Bowel sounds are normal.     Palpations: Abdomen is soft.  Musculoskeletal:     Right lower leg: No edema.     Left lower leg: No edema.  Lymphadenopathy:     Cervical: No cervical adenopathy.  Skin:    General: Skin is warm and dry.  Neurological:     General: No focal deficit present.     Mental Status: She is alert.  Psychiatric:        Mood and Affect: Mood normal.        Behavior: Behavior normal.        Thought Content: Thought content normal.        Judgment: Judgment normal.     CBC    Component Value Date/Time   WBC 5.8 02/04/2021 0647   RBC 4.58 02/04/2021 0647   HGB 13.0 02/04/2021 0647   HGB 14.2 05/13/2020 0845   HCT 38.0 02/04/2021 0647   HCT 41.8 05/13/2020 0845   PLT 152 02/04/2021 0647   PLT 196 05/13/2020 0845   MCV 83.0 02/04/2021 0647   MCV 84 05/13/2020 0845   MCH 28.4 02/04/2021 0647   MCHC 34.2 02/04/2021 0647   RDW 14.9 02/04/2021 0647   RDW 14.4 05/13/2020 0845   LYMPHSABS 1.6 01/31/2021 1245   MONOABS 0.7 01/31/2021 1245   EOSABS 0.3 01/31/2021 1245   BASOSABS 0.0 01/31/2021 1245   BMP Latest Ref Rng & Units 02/04/2021 01/31/2021 01/16/2021  Glucose 70 - 99 mg/dL 811(B) 147(W) 295(A)  BUN 6 - 20 mg/dL 14 11 9   Creatinine 0.44 - 1.00 mg/dL 2.13  0.86  BUN/Creat Ratio 9 - 23 - - -  Sodium 135 - 145 mmol/L 139 139 136  Potassium 3.5 - 5.1 mmol/L 3.7 3.8 3.9  Chloride 98 - 111 mmol/L 106 106 101  CO2 22 - 32 mmol/L 23 22 25   Calcium 8.9 - 10.3 mg/dL 9.0 9.3 5.78)    Chest imaging: Chest CT 01/31/2021 1. No evidence of pulmonary embolus. 2. Residual ground-glass opacities with peripheral predominance in bilateral lungs. Overall aeration has improved when compared to January 15, 2021. Findings are most consistent with resolving COVID-19 pneumonia.   Chest CT 01/15/21 No central, segmental, or subsegmental pulmonary embolism  Multifocal patchy airspace opacities throughout both lungs, predominantly within the lower lungs, consistent with COVID Pneumonia.   PFT: No flowsheet data found.    Assessment & Plan:   Pneumonia due to COVID-19 virus  Cough  Post-nasal drainage  Obstructive sleep apnea  Discussion: Norma [name] is a 60 year old woman, never smoker with hypertension, obstructive sleep apnea on CPAP and diabetes mellitus type II who is referred to pulmonary clinic for shortness of breath after Covid 19 pneumonia.   She had respiratory failure secondary to covid 19 pneumonia which appears to be slowly resolving as she does not require supplemental oxygen throughout the day. She should wear supplemental oxygen at night until she is able to resume regular CPAP usage. We will start her on flovent Norma Seen 2  puffs twice daily for her dyspnea related to the pneumonia.   She has post-nasal drainage leading to productive cough. She is to start fluticasone nasal spray, 1 spray per nostril daily and ipratropium nasal spray, 2 sprays twice daily.   Her issues with blood pressure are likely related to her not using CPAP since her covid 19 illness.   Follow up in 4 months with pulmonary function tests.  Melody Comas, MD [facility] Office: [phone]   See Amion for Pager Details    Current  Outpatient Medications:  .  albuterol (PROVENTIL HFA;VENTOLIN HFA) 108 (90 Base) MCG/ACT inhaler, Inhale 2 puffs into the lungs every 6 (six) hours as needed for wheezing or shortness of breath., Disp: , Rfl:  .  amLODipine (NORVASC) 5 MG tablet, Take 5 mg by mouth daily., Disp: , Rfl:  .  aspirin 81 MG tablet, Take 81 mg by mouth daily., Disp: , Rfl:  .  CINNAMON PO, Take 1,000 mg by mouth daily., Disp: , Rfl:  .  cyclobenzaprine (FLEXERIL) 5 MG tablet, Take 5 mg by mouth at bedtime as needed for muscle spasms., Disp: , Rfl:  .  escitalopram (LEXAPRO) 5 MG tablet, Take 10 mg by mouth daily. , Disp: , Rfl:  .  fluticasone (FLONASE) 50 MCG/ACT nasal spray, Place 1 spray into both nostrils 2 (two) times daily., Disp: , Rfl:  .  gabapentin (NEURONTIN) 600 MG tablet, Take 600 mg by mouth at bedtime., Disp: , Rfl:  .  guaiFENesin-dextromethorphan (ROBITUSSIN DM) 100-10 MG/5ML syrup, Take 10 mLs by mouth every 4 (four) hours as needed for cough., Disp: 118 mL, Rfl: 0 .  Lancets (ONETOUCH DELICA PLUS LANCET33G) MISC, Apply 1 each topically daily., Disp: , Rfl:  .  lisinopril (PRINIVIL,ZESTRIL) 40 MG tablet, Take 40 mg by mouth daily., Disp: , Rfl:  .  metFORMIN (GLUCOPHAGE-XR) 500 MG 24 hr tablet, Take 500 mg by mouth daily with breakfast., Disp: , Rfl:  .  metoprolol succinate (TOPROL-XL) 50 MG 24 hr tablet, Take 50 mg by mouth every evening. , Disp: , Rfl:  .  omeprazole (PRILOSEC) 20 MG capsule, Take 20 mg by mouth daily., Disp: , Rfl:  .  ONETOUCH VERIO test strip, 1 each daily., Disp: , Rfl:  .  rosuvastatin (CRESTOR) 5 MG tablet, Take 5 mg by mouth once a week. , Disp: , Rfl:  .  VITAMIN D PO, Take 5,000 Int'l Units by mouth daily., Disp: , Rfl:  .  Zinc 50 MG TABS, 1 tablet, Disp: , Rfl:

## 2021-02-16 NOTE — Patient Instructions (Addendum)
Start ipratropium nasal spray 2 sprays per nostril twice daily  Continue flonase nasal spray  Start fluticasone inhaler 2 puffs twice daily - rinse mouth out after each use  We will schedule you for pulmonary function tests at your next follow up appointment  Start to use the CPAP again as much as possible

## 2021-02-17 ENCOUNTER — Encounter: Payer: Self-pay | Admitting: Pulmonary Disease

## 2021-03-10 ENCOUNTER — Other Ambulatory Visit (HOSPITAL_COMMUNITY)
Admission: RE | Admit: 2021-03-10 | Discharge: 2021-03-10 | Disposition: A | Discharge status: Home / Self Care | Payer: Managed Care, Other (non HMO) | Source: Ambulatory Visit | Attending: Family Medicine | Admitting: Family Medicine

## 2021-03-10 ENCOUNTER — Other Ambulatory Visit: Payer: Self-pay | Admitting: Family Medicine

## 2021-03-10 DIAGNOSIS — Z124 Encounter for screening for malignant neoplasm of cervix: Secondary | ICD-10-CM | POA: Insufficient documentation

## 2021-03-11 LAB — CYTOLOGY - PAP
Comment: NEGATIVE
Diagnosis: NEGATIVE
High risk HPV: NEGATIVE

## 2021-06-03 ENCOUNTER — Other Ambulatory Visit: Payer: Self-pay

## 2021-06-04 ENCOUNTER — Other Ambulatory Visit: Payer: Self-pay

## 2021-06-04 ENCOUNTER — Other Ambulatory Visit (HOSPITAL_COMMUNITY): Payer: Managed Care, Other (non HMO)

## 2021-06-04 ENCOUNTER — Other Ambulatory Visit (HOSPITAL_COMMUNITY)
Admission: RE | Admit: 2021-06-04 | Discharge: 2021-06-04 | Disposition: A | Discharge status: Home / Self Care | Payer: Managed Care, Other (non HMO) | Source: Ambulatory Visit | Attending: Pulmonary Disease | Admitting: Pulmonary Disease

## 2021-06-04 DIAGNOSIS — Z20822 Contact with and (suspected) exposure to covid-19: Secondary | ICD-10-CM | POA: Diagnosis not present

## 2021-06-04 DIAGNOSIS — Z01812 Encounter for preprocedural laboratory examination: Secondary | ICD-10-CM | POA: Insufficient documentation

## 2021-06-04 LAB — SARS CORONAVIRUS 2 (TAT 6-24 HRS): SARS Coronavirus 2: NEGATIVE

## 2021-06-07 ENCOUNTER — Ambulatory Visit: Payer: Managed Care, Other (non HMO) | Admitting: Pulmonary Disease

## 2021-06-07 ENCOUNTER — Ambulatory Visit (INDEPENDENT_AMBULATORY_CARE_PROVIDER_SITE_OTHER): Payer: Managed Care, Other (non HMO) | Admitting: Pulmonary Disease

## 2021-06-07 ENCOUNTER — Other Ambulatory Visit: Payer: Self-pay

## 2021-06-07 ENCOUNTER — Encounter: Payer: Self-pay | Admitting: Pulmonary Disease

## 2021-06-07 VITALS — BP 128/78 | HR 83 | Temp 98.7°F | Ht 62.0 in | Wt 219.0 lb

## 2021-06-07 DIAGNOSIS — J1282 Pneumonia due to coronavirus disease 2019: Secondary | ICD-10-CM | POA: Diagnosis not present

## 2021-06-07 DIAGNOSIS — R0982 Postnasal drip: Secondary | ICD-10-CM | POA: Diagnosis not present

## 2021-06-07 DIAGNOSIS — U071 COVID-19: Secondary | ICD-10-CM

## 2021-06-07 LAB — PULMONARY FUNCTION TEST
DL/VA % pred: 145 %
DL/VA: 6.22 ml/min/mmHg/L
DLCO cor % pred: 128 %
DLCO cor: 24.17 ml/min/mmHg
DLCO unc % pred: 128 %
DLCO unc: 24.17 ml/min/mmHg
FEF 25-75 Post: 4.42 L/sec
FEF 25-75 Pre: 4.5 L/sec
FEF2575-%Change-Post: -1 %
FEF2575-%Pred-Post: 197 %
FEF2575-%Pred-Pre: 201 %
FEV1-%Change-Post: 2 %
FEV1-%Pred-Post: 107 %
FEV1-%Pred-Pre: 105 %
FEV1-Post: 2.55 L
FEV1-Pre: 2.49 L
FEV1FVC-%Change-Post: 2 %
FEV1FVC-%Pred-Pre: 119 %
FEV6-%Change-Post: 0 %
FEV6-%Pred-Post: 90 %
FEV6-%Pred-Pre: 90 %
FEV6-Post: 2.68 L
FEV6-Pre: 2.68 L
FEV6FVC-%Pred-Post: 103 %
FEV6FVC-%Pred-Pre: 103 %
FVC-%Change-Post: 0 %
FVC-%Pred-Post: 87 %
FVC-%Pred-Pre: 87 %
FVC-Post: 2.68 L
FVC-Pre: 2.68 L
Post FEV1/FVC ratio: 95 %
Post FEV6/FVC ratio: 100 %
Pre FEV1/FVC ratio: 93 %
Pre FEV6/FVC Ratio: 100 %
RV % pred: 130 %
RV: 2.46 L
TLC % pred: 108 %
TLC: 5.14 L

## 2021-06-07 NOTE — Patient Instructions (Signed)
Start fluticasone nasal spray, 1 spray daily  Work on weaning off the ipratropium nasal spray. Can stop it all together or just use the spray at night time and stop the morning time use.   After you have weaned off the ipratropium nasal spray, then work on reducing your flovent inhaler use. Can start using it 1 spray twice daily for 2 weeks then reduced to 1 puff daily for 2 weeks and then stop if possible.   If your symptoms return, then this is a sign we need to continue these medications.   Your pulmonary function test results are normal today.

## 2021-06-07 NOTE — Progress Notes (Signed)
Full PFT performed today. °

## 2021-06-07 NOTE — Progress Notes (Signed)
Synopsis: Referred in February 2022 by Dr. Alvester Chou for shortness of breath  Subjective:   PATIENT ID: Norma [name], Norma [name]   HPI  Chief Complaint  Patient presents with   Follow-up   Norma [name] is a 60 year old woman, never smoker with hypertension, obstructive sleep apnea on CPAP and diabetes mellitus type II who returns to pulmonary clinic for shortness of breath after Covid 19 pneumonia.   She was started on flovent 2 puffs, twice daily at last visit for dyspnea related to the covid 19 pneumonia and she was also started on fluticasone and ipratropium nasal spray for post nasal drainage, and is only using the ipratropium at this time. She reports her breathing is significantly improved and her sinus congestion is also improved and is now back on CPAP at night.    OV 02/16/21  She was admitted 1/21 to 1/22 at [facility] where she was provided 10 days of steroids and discharged with 2L supplemental oxygen. She had CTA chest at the time of admission which was negative for pulmonary emboli and showed diffuse bilateral ground glass infiltrates consistent with covid 19 pneumonia. She had repeat CTA chest on 01/31/21 after presenting to the ER with worsening shortness of breath, again no pulmonary emboli. She had a subsequent visit to the ER on 02/04/21 due to hypertension.   She has not been wearing her CPAP due to cough and sinus congestion. She does have post-nasal drainage. She does not find much relief from the albuterol inhaler.   She takes prilosec for GERD. She has been on lisinopril for many years.          Past Medical History:  Diagnosis Date   Arthritis    hips   Asthma    Costochondritis    Diabetes (HCC)    GERD (gastroesophageal reflux disease)    Headache    HTN (hypertension)    Hyperlipidemia    Obesity    Palpitations    Paresthesias    on Neurontin   Pinched nerve in neck    pt thinks it's  between C3 and C4   PONV (postoperative nausea and vomiting)    PVC (premature ventricular contraction)    Sleep apnea    on CPAP; now moderate.      Family History  Problem Relation Age of Onset   Other Mother        benign brain tumor    Stroke Father    Heart attack Father    Multiple sclerosis Niece      Social History   Socioeconomic History   Marital status: Married    Spouse name: Not on file   Number of children: 4   Years of education: Not on file   Highest education level: Bachelor's degree (e.g., BA, AB, BS)  Occupational History   Not on file  Tobacco Use   Smoking status: Never   Smokeless tobacco: Never  Vaping Use   Vaping Use: Never used  Substance and Sexual Activity   Alcohol use: No   Drug use: No   Sexual activity: Not Currently    Birth control/protection: Post-menopausal  Other Topics Concern   Not on file  Social History Narrative   Lives at home with husband    Right handed   Caffeine: none    Social Determinants of Health   Financial Resource Strain: Not on file  Food Insecurity: Not on file  Transportation  Needs: Not on file  Physical Activity: Not on file  Stress: Not on file  Social Connections: Not on file  Intimate Partner Violence: Not on file     Allergies  Allergen Reactions   Beconase Aq [Beclomethasone Diprop Monohyd] Shortness Of Breath   Atenolol Other (See Comments)    Depression and crying    Other     chloramycin eye drops - shortness of breath   Cephalexin Rash   Codeine Itching and Rash     Outpatient Medications Prior to Visit  Medication Sig Dispense Refill   albuterol (PROVENTIL HFA;VENTOLIN HFA) 108 (90 Base) MCG/ACT inhaler Inhale 2 puffs into the lungs every 6 (six) hours as needed for wheezing or shortness of breath.     amLODipine (NORVASC) 5 MG tablet Take 5 mg by mouth daily.     aspirin 81 MG tablet Take 81 mg by mouth daily.     CINNAMON PO Take 1,000 mg by mouth daily.     cyclobenzaprine  (FLEXERIL) 5 MG tablet Take 5 mg by mouth at bedtime as needed for muscle spasms.     escitalopram (LEXAPRO) 5 MG tablet Take 10 mg by mouth daily.      fluticasone (FLONASE) 50 MCG/ACT nasal spray Place 1 spray into both nostrils 2 (two) times daily.     fluticasone (FLOVENT HFA) 110 MCG/ACT inhaler Inhale 2 puffs into the lungs 2 (two) times daily. 1 each 12   gabapentin (NEURONTIN) 600 MG tablet Take 600 mg by mouth at bedtime.     guaiFENesin-dextromethorphan (ROBITUSSIN DM) 100-10 MG/5ML syrup Take 10 mLs by mouth every 4 (four) hours as needed for cough. 118 mL 0   ipratropium (ATROVENT) 0.03 % nasal spray Place 2 sprays into both nostrils every 12 (twelve) hours. 30 mL 12   Lancets (ONETOUCH DELICA PLUS LANCET33G) MISC Apply 1 each topically daily.     lisinopril (PRINIVIL,ZESTRIL) 40 MG tablet Take 40 mg by mouth daily.     metFORMIN (GLUCOPHAGE-XR) 500 MG 24 hr tablet Take 500 mg by mouth daily with breakfast.     metoprolol succinate (TOPROL-XL) 50 MG 24 hr tablet Take 100 mg by mouth every evening.     omeprazole (PRILOSEC) 20 MG capsule Take 20 mg by mouth daily.     ONETOUCH VERIO test strip 1 each daily.     rosuvastatin (CRESTOR) 5 MG tablet Take 5 mg by mouth once a week.      VITAMIN D PO Take 5,000 Int'l Units by mouth daily.     Zinc 50 MG TABS 1 tablet     No facility-administered medications prior to visit.    Review of Systems  Constitutional:  Negative for chills, fever, malaise/fatigue and weight loss.  HENT:  Negative for congestion, sinus pain and sore throat.   Eyes: Negative.   Respiratory:  Positive for shortness of breath. Negative for cough, hemoptysis, sputum production and wheezing.   Cardiovascular:  Negative for chest pain, palpitations, orthopnea, claudication and leg swelling.  Gastrointestinal:  Negative for abdominal pain, heartburn, nausea and vomiting.  Genitourinary: Negative.   Musculoskeletal:  Negative for joint pain and myalgias.  Skin:   Negative for rash.  Neurological:  Negative for weakness and headaches.  Endo/Heme/Allergies: Negative.   Psychiatric/Behavioral:  The patient is not nervous/anxious.    Objective:   Vitals:   06/07/21 1115  BP: 128/78  Pulse: 83  Temp: 98.7 F (37.1 C)  TempSrc: Oral  SpO2: 98%  Weight: 219 lb (  99.3 kg)  Height: 5\' 2"  (1.575 m)     Physical Exam Constitutional:      General: She is not in acute distress.    Appearance: She is obese. She is not ill-appearing.  HENT:     Head: Normocephalic and atraumatic.  Eyes:     General: No scleral icterus.    Conjunctiva/sclera: Conjunctivae normal.     Pupils: Pupils are equal, round, and reactive to light.  Cardiovascular:     Rate and Rhythm: Normal rate and regular rhythm.     Pulses: Normal pulses.     Heart sounds: Normal heart sounds. No murmur heard. Pulmonary:     Effort: Pulmonary effort is normal.     Breath sounds: Normal breath sounds. No wheezing, rhonchi or rales.  Abdominal:     General: Bowel sounds are normal.     Palpations: Abdomen is soft.  Musculoskeletal:     Right lower leg: No edema.     Left lower leg: No edema.  Lymphadenopathy:     Cervical: No cervical adenopathy.  Skin:    General: Skin is warm and dry.  Neurological:     General: No focal deficit present.     Mental Status: She is alert.  Psychiatric:        Mood and Affect: Mood normal.        Behavior: Behavior normal.        Thought Content: Thought content normal.        Judgment: Judgment normal.    CBC    Component Value Date/Time   WBC 5.8 02/04/2021 0647   RBC 4.58 02/04/2021 0647   HGB 13.0 02/04/2021 0647   HGB 14.2 05/13/2020 0845   HCT 38.0 02/04/2021 0647   HCT 41.8 05/13/2020 0845   PLT 152 02/04/2021 0647   PLT 196 05/13/2020 0845   MCV 83.0 02/04/2021 0647   MCV 84 05/13/2020 0845   MCH 28.4 02/04/2021 0647   MCHC 34.2 02/04/2021 0647   RDW 14.9 02/04/2021 0647   RDW 14.4 05/13/2020 0845   LYMPHSABS 1.6  01/31/2021 1245   MONOABS 0.7 01/31/2021 1245   EOSABS 0.3 01/31/2021 1245   BASOSABS 0.0 01/31/2021 1245   BMP Latest Ref Rng & Units 02/04/2021 01/31/2021 01/16/2021  Glucose 70 - 99 mg/dL 01/18/2021) 443(X) 540(G)  BUN 6 - 20 mg/dL 14 11 9   Creatinine 0.44 - 1.00 mg/dL 867(Y 1.95  BUN/Creat Ratio 9 - 23 - - -  Sodium 135 - 145 mmol/L 139 139 136  Potassium 3.5 - 5.1 mmol/L 3.7 3.8 3.9  Chloride 98 - 111 mmol/L 106 106 101  CO2 22 - 32 mmol/L 23 22 25   Calcium 8.9 - 10.3 mg/dL 9.0 9.3 0.93)    Chest imaging: Chest CT 01/31/2021 1. No evidence of pulmonary embolus. 2. Residual ground-glass opacities with peripheral predominance in bilateral lungs. Overall aeration has improved when compared to January 15, 2021. Findings are most consistent with resolving COVID-19 pneumonia.   Chest CT 01/15/21 No central, segmental, or subsegmental pulmonary embolism   Multifocal patchy airspace opacities throughout both lungs, predominantly within the lower lungs, consistent with COVID Pneumonia.   PFT: PFT Results Latest Ref Rng & Units 06/07/2021  FVC-Pre L 2.68  FVC-Predicted Pre % 87  FVC-Post L 2.68  FVC-Predicted Post % 87  Pre FEV1/FVC % % 93  Post FEV1/FCV % % 95  FEV1-Pre L 2.49  FEV1-Predicted Pre % 105  FEV1-Post L 2.55  DLCO uncorrected  ml/min/mmHg 24.17  DLCO UNC% % 128  DLCO corrected ml/min/mmHg 24.17  DLCO COR %Predicted % 128  DLVA Predicted % 145  TLC L 5.14  TLC % Predicted % 108  RV % Predicted % 130  PFTs: Normal pulmonary function     Assessment & Plan:   Post-nasal drainage  Pneumonia due to COVID-19 virus  Discussion: Norma [name] is a 60 year old woman, never smoker with hypertension, obstructive sleep apnea on CPAP and diabetes mellitus type II who returns to pulmonary clinic for shortness of breath after Covid 19 pneumonia.   Her shortness of breath has improved since last visit after using flovent inhaler and using ipratropium nasal spray for sinus  congestion relief. She has resumed her nightly CPAP use now that her sinuses are clear.   She is to resume fluticasone nasal spray as her maintenance nasal spray and work on weaning off the ipratropium nasal spray.   Once she has weaned off the ipratropium nasal spray, then she is to work on weaning off the flovent inhaler if possible. Her PFTs are normal today.  Follow up in 4 months.  Melody Comas, MD [facility] Office: [phone]    Current Outpatient Medications:    albuterol (PROVENTIL HFA;VENTOLIN HFA) 108 (90 Base) MCG/ACT inhaler, Inhale 2 puffs into the lungs every 6 (six) hours as needed for wheezing or shortness of breath., Disp: , Rfl:    amLODipine (NORVASC) 5 MG tablet, Take 5 mg by mouth daily., Disp: , Rfl:    aspirin 81 MG tablet, Take 81 mg by mouth daily., Disp: , Rfl:    CINNAMON PO, Take 1,000 mg by mouth daily., Disp: , Rfl:    cyclobenzaprine (FLEXERIL) 5 MG tablet, Take 5 mg by mouth at bedtime as needed for muscle spasms., Disp: , Rfl:    escitalopram (LEXAPRO) 5 MG tablet, Take 10 mg by mouth daily. , Disp: , Rfl:    fluticasone (FLONASE) 50 MCG/ACT nasal spray, Place 1 spray into both nostrils 2 (two) times daily., Disp: , Rfl:    fluticasone (FLOVENT HFA) 110 MCG/ACT inhaler, Inhale 2 puffs into the lungs 2 (two) times daily., Disp: 1 each, Rfl: 12   gabapentin (NEURONTIN) 600 MG tablet, Take 600 mg by mouth at bedtime., Disp: , Rfl:    guaiFENesin-dextromethorphan (ROBITUSSIN DM) 100-10 MG/5ML syrup, Take 10 mLs by mouth every 4 (four) hours as needed for cough., Disp: 118 mL, Rfl: 0   ipratropium (ATROVENT) 0.03 % nasal spray, Place 2 sprays into both nostrils every 12 (twelve) hours., Disp: 30 mL, Rfl: 12   Lancets (ONETOUCH DELICA PLUS LANCET33G) MISC, Apply 1 each topically daily., Disp: , Rfl:    lisinopril (PRINIVIL,ZESTRIL) 40 MG tablet, Take 40 mg by mouth daily., Disp: , Rfl:    metFORMIN (GLUCOPHAGE-XR) 500 MG 24 hr  tablet, Take 500 mg by mouth daily with breakfast., Disp: , Rfl:    metoprolol succinate (TOPROL-XL) 50 MG 24 hr tablet, Take 100 mg by mouth every evening., Disp: , Rfl:    omeprazole (PRILOSEC) 20 MG capsule, Take 20 mg by mouth daily., Disp: , Rfl:    ONETOUCH VERIO test strip, 1 each daily., Disp: , Rfl:    rosuvastatin (CRESTOR) 5 MG tablet, Take 5 mg by mouth once a week. , Disp: , Rfl:    VITAMIN D PO, Take 5,000 Int'l Units by mouth daily., Disp: , Rfl:    Zinc 50 MG TABS, 1 tablet, Disp: , Rfl:

## 2021-06-07 NOTE — Patient Instructions (Signed)
Full PFT performed today. °

## 2021-06-11 ENCOUNTER — Encounter: Payer: Self-pay | Admitting: Pulmonary Disease

## 2021-07-20 ENCOUNTER — Other Ambulatory Visit: Payer: Self-pay | Admitting: Family Medicine

## 2021-07-20 DIAGNOSIS — Z1231 Encounter for screening mammogram for malignant neoplasm of breast: Secondary | ICD-10-CM

## 2021-07-26 ENCOUNTER — Other Ambulatory Visit: Payer: Self-pay

## 2021-07-26 ENCOUNTER — Ambulatory Visit
Admission: RE | Admit: 2021-07-26 | Discharge: 2021-07-26 | Disposition: A | Discharge status: Home / Self Care | Payer: Managed Care, Other (non HMO) | Source: Ambulatory Visit

## 2021-07-26 DIAGNOSIS — Z1231 Encounter for screening mammogram for malignant neoplasm of breast: Secondary | ICD-10-CM

## 2021-07-28 NOTE — Telephone Encounter (Signed)
Hello Dr. Francine Graven, please advise on mychart message, thanks!:   I weaned off Ipratropium Bromide and then weaned off Flovent inhaler successfully!  Can I now wean off the Fluticasone Propionate nasal spray? Thank you for helping me through this.

## 2022-02-20 ENCOUNTER — Emergency Department (HOSPITAL_BASED_OUTPATIENT_CLINIC_OR_DEPARTMENT_OTHER)
Admission: EM | Admit: 2022-02-20 | Discharge: 2022-02-20 | Disposition: A | Discharge status: Home / Self Care | Payer: Managed Care, Other (non HMO) | Attending: Emergency Medicine | Admitting: Emergency Medicine

## 2022-02-20 ENCOUNTER — Other Ambulatory Visit: Payer: Self-pay

## 2022-02-20 ENCOUNTER — Emergency Department (HOSPITAL_BASED_OUTPATIENT_CLINIC_OR_DEPARTMENT_OTHER): Payer: Managed Care, Other (non HMO) | Admitting: Radiology

## 2022-02-20 ENCOUNTER — Encounter (HOSPITAL_BASED_OUTPATIENT_CLINIC_OR_DEPARTMENT_OTHER): Payer: Self-pay | Admitting: Emergency Medicine

## 2022-02-20 DIAGNOSIS — J45909 Unspecified asthma, uncomplicated: Secondary | ICD-10-CM | POA: Insufficient documentation

## 2022-02-20 DIAGNOSIS — R0789 Other chest pain: Secondary | ICD-10-CM | POA: Diagnosis not present

## 2022-02-20 DIAGNOSIS — Z7951 Long term (current) use of inhaled steroids: Secondary | ICD-10-CM | POA: Insufficient documentation

## 2022-02-20 DIAGNOSIS — I10 Essential (primary) hypertension: Secondary | ICD-10-CM | POA: Insufficient documentation

## 2022-02-20 DIAGNOSIS — Z7984 Long term (current) use of oral hypoglycemic drugs: Secondary | ICD-10-CM | POA: Insufficient documentation

## 2022-02-20 DIAGNOSIS — R072 Precordial pain: Secondary | ICD-10-CM | POA: Diagnosis present

## 2022-02-20 DIAGNOSIS — R002 Palpitations: Secondary | ICD-10-CM | POA: Insufficient documentation

## 2022-02-20 DIAGNOSIS — Z7982 Long term (current) use of aspirin: Secondary | ICD-10-CM | POA: Insufficient documentation

## 2022-02-20 DIAGNOSIS — Z79899 Other long term (current) drug therapy: Secondary | ICD-10-CM | POA: Diagnosis not present

## 2022-02-20 DIAGNOSIS — E119 Type 2 diabetes mellitus without complications: Secondary | ICD-10-CM | POA: Insufficient documentation

## 2022-02-20 LAB — CBC
HCT: 43.7 % (ref 36.0–46.0)
Hemoglobin: 14.7 g/dL (ref 12.0–15.0)
MCH: 28.1 pg (ref 26.0–34.0)
MCHC: 33.6 g/dL (ref 30.0–36.0)
MCV: 83.6 fL (ref 80.0–100.0)
Platelets: 251 10*3/uL (ref 150–400)
RBC: 5.23 MIL/uL — ABNORMAL HIGH (ref 3.87–5.11)
RDW: 14.2 % (ref 11.5–15.5)
WBC: 9.6 10*3/uL (ref 4.0–10.5)
nRBC: 0 % (ref 0.0–0.2)

## 2022-02-20 LAB — BASIC METABOLIC PANEL
Anion gap: 12 (ref 5–15)
BUN: 12 mg/dL (ref 8–23)
CO2: 24 mmol/L (ref 22–32)
Calcium: 9.8 mg/dL (ref 8.9–10.3)
Chloride: 103 mmol/L (ref 98–111)
Creatinine, Ser: 0.49 mg/dL (ref 0.44–1.00)
GFR, Estimated: >60 mL/min (ref >60)
Glucose, Bld: 151 mg/dL — ABNORMAL HIGH (ref 70–99)
Potassium: 3.8 mmol/L (ref 3.5–5.1)
Sodium: 139 mmol/L (ref 135–145)

## 2022-02-20 LAB — TROPONIN I (HIGH SENSITIVITY)
Troponin I (High Sensitivity): 2 ng/L (ref <18)
Troponin I (High Sensitivity): 2 ng/L (ref <18)

## 2022-02-20 MED ORDER — SODIUM CHLORIDE 0.9 % IV BOLUS
1000.0000 mL | Freq: Once | INTRAVENOUS | Status: AC
Start: 2022-02-20 — End: 2022-02-20
  Administered 2022-02-20: 1000 mL via INTRAVENOUS

## 2022-02-20 MED ORDER — KETOROLAC TROMETHAMINE 15 MG/ML IJ SOLN
15.0000 mg | Freq: Once | INTRAMUSCULAR | Status: AC
  Administered 2022-02-20: 15 mg via INTRAVENOUS
  Filled 2022-02-20: qty 1

## 2022-02-20 NOTE — ED Triage Notes (Signed)
Intermittent mid chest discomfort x 3 days only on the mornings.

## 2022-02-20 NOTE — Discharge Instructions (Signed)

## 2022-02-20 NOTE — ED Triage Notes (Signed)
Adds left arm pain and left jaw pain .

## 2022-02-20 NOTE — ED Provider Notes (Signed)
[facility] EMERGENCY DEPT Provider Note   CSN: [id number] Arrival date & time: 02/20/22  [id number]     History  Chief Complaint  Patient presents with   Chest Pain    Norma [name] is a 61 y.o. female.  This is a 61 y.o. female with significant medical history as below, including OSA on CPAP, hyperlipidemia, hypertension, obesity, palpitations who presents to the ED with complaint of chest discomfort.     Past Medical History: No date: Arthritis     Comment:  hips No date: Asthma No date: Costochondritis No date: Diabetes (HCC) No date: GERD (gastroesophageal reflux disease) No date: Headache No date: HTN (hypertension) No date: Hyperlipidemia No date: Obesity No date: Palpitations No date: Paresthesias     Comment:  on Neurontin No date: Pinched nerve in neck     Comment:  pt thinks it's between C3 and C4 No date: PONV (postoperative nausea and vomiting) No date: PVC (premature ventricular contraction) No date: Sleep apnea     Comment:  on CPAP; now moderate.   Past Surgical History: No date: BREAST MASS EXCISION; Left No date: CESAREAN SECTION 09/23/2016: CHOLECYSTECTOMY; N/A     Comment:  Procedure: LAPAROSCOPIC CHOLECYSTECTOMY WITH               INTRAOPERATIVE CHOLANGIOGRAM;  Surgeon: Johnathan Hausen,               MD;  Location: [facility];  Service: General;  Laterality:               N/A; No date: EYE SURGERY     Comment:  to correct cross sidedness  No date: novasure procedure  07/20/2018: RADIOACTIVE SEED GUIDED EXCISIONAL BREAST BIOPSY; Left     Comment:  Procedure: RADIOACTIVE SEED GUIDED EXCISIONAL BREAST               BIOPSY;  Surgeon: Johnathan Hausen, MD;  Location: [facility];  Service: General;  Laterality:               Left; No date: TUBAL LIGATION     Chest Pain Chest pain location: Substernal. Pain quality: aching and radiating   Pain radiates to:  L jaw and L arm Pain severity:  Mild Onset  quality:  Sudden Duration:  60 minutes Timing:  Intermittent Progression:  Unchanged Chronicity:  New Context: lifting   Relieved by:  Rest Worsened by:  Nothing Ineffective treatments:  Rest Associated symptoms: no abdominal pain, no altered mental status, no cough, no dizziness, no dysphagia, no fever, no headache, no nausea, no near-syncope, no numbness, no orthopnea, no palpitations, no shortness of breath, no syncope and no vomiting   Risk factors: high cholesterol, hypertension and obesity       Home Medications Prior to Admission medications   Medication Sig Start Date End Date Taking? Authorizing Provider  albuterol (PROVENTIL HFA;VENTOLIN HFA) 108 (90 Base) MCG/ACT inhaler Inhale 2 puffs into the lungs every 6 (six) hours as needed for wheezing or shortness of breath.    [provider]  amLODipine (NORVASC) 5 MG tablet Take 5 mg by mouth daily. 02/11/21   [provider]  aspirin 81 MG tablet Take 81 mg by mouth daily.    [provider]  cyclobenzaprine (FLEXERIL) 5 MG tablet Take 5 mg by mouth at bedtime as needed for muscle spasms.    [provider]  escitalopram (LEXAPRO) 5 MG tablet Take 10 mg by mouth daily.  03/10/20   [provider]  fluticasone (FLONASE) 50 MCG/ACT nasal spray Place 1 spray into both nostrils 2 (two) times daily. 02/02/21   [provider]  fluticasone (FLOVENT HFA) 110 MCG/ACT inhaler Inhale 2 puffs into the lungs 2 (two) times daily. 02/16/21   Freddi Starr, MD  gabapentin (NEURONTIN) 600 MG tablet Take 600 mg by mouth at bedtime.    [provider]  ipratropium (ATROVENT) 0.03 % nasal spray Place 2 sprays into both nostrils every 12 (twelve) hours. 02/16/21   Freddi Starr, MD  Lancets [facility] DELICA PLUS [id number]) MISC Apply 1 each topically daily. 03/10/20   [provider]  metFORMIN (GLUCOPHAGE-XR) 500 MG 24 hr tablet Take 500 mg by mouth daily with breakfast. 03/10/20    [provider]  metoprolol succinate (TOPROL-XL) 50 MG 24 hr tablet Take 100 mg by mouth every evening. 08/08/16   [provider]  omeprazole (PRILOSEC) 20 MG capsule Take 20 mg by mouth daily. 03/31/20   [provider]  [facility] VERIO test strip 1 each daily. 03/10/20   [provider]  rosuvastatin (CRESTOR) 5 MG tablet Take 5 mg by mouth once a week.  04/07/20   [provider]  VITAMIN D PO Take 5,000 Int'l Units by mouth daily.    [provider]  Zinc 50 MG TABS 1 tablet    [provider]      Allergies    Beconase aq [beclomethasone diprop monohyd], Atenolol, Other, Cephalexin, and Codeine    Review of Systems   Review of Systems  Constitutional:  Negative for chills and fever.  HENT:  Negative for facial swelling and trouble swallowing.   Eyes:  Negative for photophobia and visual disturbance.  Respiratory:  Negative for cough and shortness of breath.   Cardiovascular:  Positive for chest pain. Negative for palpitations, orthopnea, syncope and near-syncope.  Gastrointestinal:  Negative for abdominal pain, nausea and vomiting.  Endocrine: Negative for polydipsia and polyuria.  Genitourinary:  Negative for difficulty urinating and hematuria.  Musculoskeletal:  Negative for gait problem and joint swelling.  Skin:  Negative for pallor and rash.  Neurological:  Negative for dizziness, syncope, numbness and headaches.  Psychiatric/Behavioral:  Negative for agitation and confusion.    Physical Exam Updated Vital Signs BP 124/61 (BP Location: Right Arm)    Pulse 64    Temp 99 F (37.2 C)    Resp 18    Wt 93.9 kg    LMP  (LMP Unknown)    SpO2 98%    BMI 37.86 kg/m  Physical Exam Vitals and nursing note reviewed.  Constitutional:      General: She is not in acute distress.    Appearance: Normal appearance. She is well-developed. She is obese. She is not ill-appearing, toxic-appearing or diaphoretic.  HENT:     Head:  Normocephalic and atraumatic.     Comments: No carotid bruit    Right Ear: External ear normal.     Left Ear: External ear normal.     Nose: Nose normal.     Mouth/Throat:     Mouth: Mucous membranes are moist.  Eyes:     General: No scleral icterus.       Right eye: No discharge.        Left eye: No discharge.  Cardiovascular:     Rate and Rhythm: Normal rate and regular rhythm.  Pulses: Normal pulses.          Radial pulses are 2+ on the right side and 2+ on the left side.       Dorsalis pedis pulses are 2+ on the right side and 2+ on the left side.     Heart sounds: Normal heart sounds. No murmur heard. Pulmonary:     Effort: Pulmonary effort is normal. No respiratory distress.     Breath sounds: Normal breath sounds.  Abdominal:     General: Abdomen is flat.     Tenderness: There is no abdominal tenderness.  Musculoskeletal:        General: Normal range of motion.     Cervical back: Full passive range of motion without pain and normal range of motion.     Right lower leg: No edema.     Left lower leg: No edema.  Skin:    General: Skin is warm and dry.     Capillary Refill: Capillary refill takes less than 2 seconds.  Neurological:     Mental Status: She is alert and oriented to person, place, and time.     GCS: GCS eye subscore is 4. GCS verbal subscore is 5. GCS motor subscore is 6.  Psychiatric:        Mood and Affect: Mood normal.        Behavior: Behavior normal.    ED Results / Procedures / Treatments   Labs (all labs ordered are listed, but only abnormal results are displayed) Labs Reviewed  BASIC METABOLIC PANEL - Abnormal; Notable for the following components:      Result Value   Glucose, Bld 151 (*)    All other components within normal limits  CBC - Abnormal; Notable for the following components:   RBC 5.23 (*)    All other components within normal limits  TROPONIN I (HIGH SENSITIVITY)  TROPONIN I (HIGH SENSITIVITY)    EKG EKG  Interpretation  Date/Time:  Sunday February 20 2022 09:54:01 EST Ventricular Rate:  64 PR Interval:  150 QRS Duration: 94 QT Interval:  400 QTC Calculation: 412 R Axis:   -20 Text Interpretation: Normal sinus rhythm Abnormal ECG When compared with ECG of 04-Feb-2021 06:08, PREVIOUS ECG IS PRESENT similar to prior tracing no stemi Confirmed by Wynona Dove ([id number]) on 02/20/2022 10:29:52 AM  Radiology DG Chest 2 View  Result Date: 02/20/2022 CLINICAL DATA:  Chest pain EXAM: CHEST - 2 VIEW COMPARISON:  01/31/2021 FINDINGS: The heart size and mediastinal contours are within normal limits. Both lungs are clear. The visualized skeletal structures are unremarkable. IMPRESSION: No active cardiopulmonary disease. Electronically Signed   By: Elmer Picker M.D.   On: 02/20/2022 10:24    Procedures Procedures    Medications Ordered in ED Medications  ketorolac (TORADOL) 15 MG/ML injection 15 mg (15 mg Intravenous Given 02/20/22 1230)  sodium chloride 0.9 % bolus 1,000 mL ( Intravenous Stopped 02/20/22 1337)    ED Course/ Medical Decision Making/ A&P                           Medical Decision Making Amount and/or Complexity of Data Reviewed Labs: ordered. Radiology: ordered.  Risk Prescription drug management. Decision regarding hospitalization.    CC: cp  This patient presents to the Emergency Department for the above complaint. This involves an extensive number of treatment options and is a complaint that carries with it a high risk of complications and morbidity. Vital  signs were reviewed. Serious etiologies considered.  Record review:  Previous records obtained and reviewed   Additional history obtained from spouse  Medical and surgical history as noted above.   Work up as above, notable for:  Labs & imaging results that were available during my care of the patient were visualized by me and considered in my medical decision making.   I ordered imaging studies which  included CXR and I visualized the imaging and I agree with radiologist interpretation. No acute process noted  Cardiac monitoring reviewed and interpreted personally which shows NSR  Social determinants of health include - N/a  Management: IVF/ toradol  Reassessment:  Asymptomatic, ambulatory w/ steady gait, no cp at time of discharge.    The patient's chest pain is not suggestive of pulmonary embolus, cardiac ischemia, aortic dissection, pericarditis, myocarditis, pulmonary embolism, pneumothorax, pneumonia, Zoster, or esophageal perforation, or other serious etiology.  Historically not abrupt in onset, tearing or ripping, pulses symmetric. EKG nonspecific for ischemia/infarction. No dysrhythmias, brugada, WPW, prolonged QT noted.    Troponin negative x2. CXR reviewed. Labs without demonstration of acute pathology unless otherwise noted above. Low HEART Score: 0-3 points (0.9-1.7% risk of MACE).   Given the extremely low risk of these diagnoses further testing and evaluation for these possibilities does not appear to be indicated at this time. Patient in no distress and overall condition improved here in the ED. Detailed discussions were had with the patient regarding current findings, and need for close f/u with PCP or on call doctor. The patient has been instructed to return immediately if the symptoms worsen in any way for re-evaluation. Patient verbalized understanding and is in agreement with current care plan. All questions answered prior to discharge.  F/u w/ cardiology as o/p. Hospitalization considered however feel patient would benefit  from outpatient management.   This chart was dictated using voice recognition software.  Despite best efforts to proofread,  errors can occur which can change the documentation meaning.         Final Clinical Impression(s) / ED Diagnoses Final diagnoses:  Atypical chest pain    Rx / DC Orders ED Discharge Orders     None          Jeanell Sparrow, DO 02/20/22 1545

## 2022-02-23 ENCOUNTER — Other Ambulatory Visit: Payer: Self-pay

## 2022-02-23 ENCOUNTER — Encounter: Payer: Self-pay | Admitting: Cardiology

## 2022-02-23 ENCOUNTER — Ambulatory Visit: Payer: Managed Care, Other (non HMO) | Admitting: Cardiology

## 2022-02-23 VITALS — BP 120/80 | HR 64 | Ht 62.0 in | Wt 204.6 lb

## 2022-02-23 DIAGNOSIS — R0602 Shortness of breath: Secondary | ICD-10-CM | POA: Diagnosis not present

## 2022-02-23 DIAGNOSIS — R5383 Other fatigue: Secondary | ICD-10-CM | POA: Diagnosis not present

## 2022-02-23 DIAGNOSIS — R079 Chest pain, unspecified: Secondary | ICD-10-CM

## 2022-02-23 DIAGNOSIS — G4733 Obstructive sleep apnea (adult) (pediatric): Secondary | ICD-10-CM

## 2022-02-23 DIAGNOSIS — E785 Hyperlipidemia, unspecified: Secondary | ICD-10-CM

## 2022-02-23 DIAGNOSIS — Z79899 Other long term (current) drug therapy: Secondary | ICD-10-CM

## 2022-02-23 MED ORDER — NITROGLYCERIN 0.4 MG SL SUBL: 0.4000 mg | SUBLINGUAL_TABLET | SUBLINGUAL | 3 refills | Status: DC | PRN

## 2022-02-23 NOTE — H&P (View-Only) (Signed)
°Cardiology Office Note:   ° °Date:  02/23/2022  ° °ID:  Norma [name], DOB 09/22/1961, MRN [id number] ° °PCP:  Harris, William, MD  °Cardiologist:  Pao Haffey, DO  °Electrophysiologist:  None  ° °Referring MD: Harris, William, MD  ° °" I am having chest pain"  ° °History of Present Illness:   ° °Norma [name] is a 60 y.o. female with a hx of Type 2 diabetes , morbid obesity, hyperlipidemia and hypertension here today to be evaluated for chest discomfort.  The patient tells me that over the last several weeks she has had midsternal chest discomfort.  Which feels more like a dull achy pain.  For last Wednesday she had a pain where she had a dull aching sensation which radiated to her jaw and of her shoulder and down her arm.  It lasted for few minutes prior to resolution.  She has had the pain off and on.  And has been concerned because her father had an MI at a young age.  She admits to associated shortness of breath as well. ° °Past Medical History:  °Diagnosis Date  ° Arthritis   ° hips  ° Asthma   ° Costochondritis   ° Diabetes (HCC)   ° GERD (gastroesophageal reflux disease)   ° Headache   ° HTN (hypertension)   ° Hyperlipidemia   ° Obesity   ° Palpitations   ° Paresthesias   ° on Neurontin  ° Pinched nerve in neck   ° pt thinks it's between C3 and C4  ° PONV (postoperative nausea and vomiting)   ° PVC (premature ventricular contraction)   ° Sleep apnea   ° on CPAP; now moderate.   ° ° °Past Surgical History:  °Procedure Laterality Date  ° BREAST MASS EXCISION Left   ° CESAREAN SECTION    ° CHOLECYSTECTOMY N/A 09/23/2016  ° Procedure: LAPAROSCOPIC CHOLECYSTECTOMY WITH INTRAOPERATIVE CHOLANGIOGRAM;  Surgeon: Matthew Martin, MD;  Location: [facility];  Service: General;  Laterality: N/A;  ° EYE SURGERY    ° to correct cross sidedness   ° novasure procedure     ° RADIOACTIVE SEED GUIDED EXCISIONAL BREAST BIOPSY Left 07/20/2018  ° Procedure: RADIOACTIVE SEED GUIDED EXCISIONAL BREAST BIOPSY;  Surgeon: Martin, Matthew, MD;   Location: [facility];  Service: General;  Laterality: Left;  ° TUBAL LIGATION    ° ° °Current Medications: °Current Meds  °Medication Sig  ° albuterol (PROVENTIL HFA;VENTOLIN HFA) 108 (90 Base) MCG/ACT inhaler Inhale 2 puffs into the lungs every 6 (six) hours as needed for wheezing or shortness of breath.  ° amLODipine (NORVASC) 5 MG tablet Take 5 mg by mouth daily.  ° aspirin 81 MG tablet Take 81 mg by mouth daily.  ° cyclobenzaprine (FLEXERIL) 5 MG tablet Take 5 mg by mouth at bedtime as needed for muscle spasms.  ° escitalopram (LEXAPRO) 5 MG tablet Take 10 mg by mouth daily.   ° gabapentin (NEURONTIN) 600 MG tablet Take 600 mg by mouth at bedtime.  ° Lancets (ONETOUCH DELICA PLUS LANCET33G) MISC Apply 1 each topically daily.  ° metFORMIN (GLUCOPHAGE-XR) 500 MG 24 hr tablet Take 500 mg by mouth daily with breakfast.  ° metoprolol succinate (TOPROL-XL) 50 MG 24 hr tablet Take 100 mg by mouth every evening.  ° nitroGLYCERIN (NITROSTAT) 0.4 MG SL tablet Place 1 tablet (0.4 mg total) under the tongue every 5 (five) minutes as needed for chest pain. Up to 3 times.  ° ONETOUCH VERIO test strip   1 each daily.   VITAMIN D PO Take 5,000 Int'l Units by mouth daily.     Allergies:   Beconase aq [beclomethasone diprop monohyd], Atenolol, Other, Cephalexin, and Codeine   Social History   Socioeconomic History   Marital status: Married    Spouse name: Not on file   Number of children: 4   Years of education: Not on file   Highest education level: Bachelor's degree (e.g., BA, AB, BS)  Occupational History   Not on file  Tobacco Use   Smoking status: Never   Smokeless tobacco: Never  Vaping Use   Vaping Use: Never used  Substance and Sexual Activity   Alcohol use: No   Drug use: No   Sexual activity: Not Currently    Birth control/protection: Post-menopausal  Other Topics Concern   Not on file  Social History Narrative   Lives at home with husband    Right handed   Caffeine: none     Social Determinants of Health   Financial Resource Strain: Not on file  Food Insecurity: Not on file  Transportation Needs: Not on file  Physical Activity: Not on file  Stress: Not on file  Social Connections: Not on file     Family History: The patient's family history includes Heart attack in her father; Multiple sclerosis in her niece; Other in her mother; Stroke in her father.  ROS:   Review of Systems  Constitution: Negative for decreased appetite, fever and weight gain.  HENT: Negative for congestion, ear discharge, hoarse voice and sore throat.   Eyes: Negative for discharge, redness, vision loss in right eye and visual halos.  Cardiovascular: Negative for chest pain, dyspnea on exertion, leg swelling, orthopnea and palpitations.  Respiratory: Negative for cough, hemoptysis, shortness of breath and snoring.   Endocrine: Negative for heat intolerance and polyphagia.  Hematologic/Lymphatic: Negative for bleeding problem. Does not bruise/bleed easily.  Skin: Negative for flushing, nail changes, rash and suspicious lesions.  Musculoskeletal: Negative for arthritis, joint pain, muscle cramps, myalgias, neck pain and stiffness.  Gastrointestinal: Negative for abdominal pain, bowel incontinence, diarrhea and excessive appetite.  Genitourinary: Negative for decreased libido, genital sores and incomplete emptying.  Neurological: Negative for brief paralysis, focal weakness, headaches and loss of balance.  Psychiatric/Behavioral: Negative for altered mental status, depression and suicidal ideas.  Allergic/Immunologic: Negative for HIV exposure and persistent infections.    EKGs/Labs/Other Studies Reviewed:    The following studies were reviewed today:   EKG:  The ekg ordered today demonstrates sinus rhythm, heart rate 64 bpm.  Recent Labs: 02/20/2022: BUN 12; Creatinine, Ser 0.49; Hemoglobin 14.7; Platelets 251; Potassium 3.8; Sodium 139  Recent Lipid Panel    Component  Value Date/Time   TRIG 151 (H) 01/14/2021 2335    Physical Exam:    VS:  BP 120/80 (BP Location: Left Arm, Patient Position: Sitting)    Pulse 64    Ht 5\' 2"  (1.575 m)    Wt 204 lb 9.6 oz (92.8 kg)    LMP  (LMP Unknown)    SpO2 96%    BMI 37.42 kg/m     Wt Readings from Last 3 Encounters:  02/23/22 204 lb 9.6 oz (92.8 kg)  02/20/22 207 lb (93.9 kg)  06/07/21 219 lb (99.3 kg)     GEN: Well nourished, well developed in no acute distress HEENT: Normal NECK: No JVD; No carotid bruits LYMPHATICS: No lymphadenopathy CARDIAC: S1S2 noted,RRR, 2/6 midsystolic ejection murmurs, rubs, gallops RESPIRATORY:  Clear to auscultation  without rales, wheezing or rhonchi  ABDOMEN: Soft, non-tender, non-distended, +bowel sounds, no guarding. EXTREMITIES: No edema, No cyanosis, no clubbing MUSCULOSKELETAL:  No deformity  SKIN: Warm and dry NEUROLOGIC:  Alert and oriented x 3, non-focal PSYCHIATRIC:  Normal affect, good insight  ASSESSMENT:    1. SOB (shortness of breath)   2. Fatigue, unspecified type   3. Chest pain of uncertain etiology   4. Hyperlipidemia, unspecified hyperlipidemia type   5. OSA (obstructive sleep apnea)   6. Medication management    PLAN:     1.  Her pain does sound typical for angina and she has significant risk factors for coronary artery disease include premature family history, diabetes mellitus, hyperlipidemia, hypertension.  Intermediate to high rates it would be best to move forward with ischemic evaluation in this patient as well as in the setting of her OSA and significant shortness of breath we will be able to get information on her right-sided heart pressures.  A heart catheterization (right and left heart catheterization) at this time would be the most appropriate testing.  I have discussed with the patient about that she is agreeable to proceed.  The patient understands that risks include but are not limited to stroke (1 in 1000), death (1 in 52), kidney  failure [usually temporary] (1 in 500), bleeding (1 in 200), allergic reaction [possibly serious] (1 in 200), and agrees to proceed.  Again echocardiogram to assess for any aortic stenosis or sclerosis.  Hypertension-blood pressure is acceptable, continue with current antihypertensive regimen.  Hyperlipidemia - continue with current statin medication.  The patient understands the need to lose weight with diet and exercise. We have discussed specific strategies for this.  Continue CPAP  The patient is in agreement with the above plan. The patient left the office in stable condition.  The patient will follow up in   Medication Adjustments/Labs and Tests Ordered: Current medicines are reviewed at length with the patient today.  Concerns regarding medicines are outlined above.  Orders Placed This Encounter  Procedures   Basic Metabolic Panel (BMET)   Magnesium   CBC with Differential/Platelet   EKG 12-Lead   ECHOCARDIOGRAM COMPLETE   Meds ordered this encounter  Medications   nitroGLYCERIN (NITROSTAT) 0.4 MG SL tablet    Sig: Place 1 tablet (0.4 mg total) under the tongue every 5 (five) minutes as needed for chest pain. Up to 3 times.    Dispense:  90 tablet    Refill:  3    Patient Instructions  Medication Instructions:  Your physician has recommended you make the following change in your medication:  START: Nitroglycerin 0.4 mg take one tablet by mouth every 5 minutes up to three times as needed for chest pain  *If you need a refill on your cardiac medications before your next appointment, please call your pharmacy*   Lab Work: Your physician recommends that you return for lab work in:  TODAY: BMET, [place], CBC  If you have labs (blood work) drawn today and your tests are completely normal, you will receive your results only by: MyChart Message (if you have MyChart) OR A paper copy in the mail If you have any lab test that is abnormal or we need to change your treatment, we  will call you to review the results.   Testing/Procedures:  [facility] [address] [place] [address] Dept: [phone] Loc: [place]  02/23/2022  You  are scheduled for a Cardiac Catheterization on Wednesday, March 8 with Dr. Lenna Sciara.  1. Please arrive at the [facility] (Main Entrance A) at [facility]: [address] [place], [place] [address] at 8:00 AM (This time is two hours before your procedure to ensure your preparation). Free valet parking service is available.   Special note: Every effort is made to have your procedure done on time. Please understand that emergencies sometimes delay scheduled procedures.  2. Diet: Do not eat solid foods after midnight.  The patient may have clear liquids until 5am upon the day of the procedure.  3. Labs: You will need to have blood drawn on TODAY.  4. Medication instructions in preparation for your procedure:   Contrast Allergy: No   On the morning of your procedure, take your Aspirin and any morning medicines NOT listed above.  You may use sips of water.  5. Plan for one night stay--bring personal belongings. 6. Bring a current list of your medications and current insurance cards. 7. You MUST have a responsible person to drive you home. 8. Someone MUST be with you the first 24 hours after you arrive home or your discharge will be delayed. 9. Please wear clothes that are easy to get on and off and wear slip-on shoes.  Thank you for allowing [place] to care for you!   -- [facility]    Follow-Up: At [facility], you and your health needs are our priority.  As part of our continuing mission to provide you with exceptional heart care, we have created designated Provider Care Teams.  These Care Teams include your primary Cardiologist (physician) and Advanced Practice Providers  (APPs -  Physician Assistants and Nurse Practitioners) who all work together to provide you with the care you need, when you need it.  We recommend signing up for the patient portal called "MyChart".  Sign up information is provided on this After Visit Summary.  MyChart is used to connect with patients for Virtual Visits (Telemedicine).  Patients are able to view lab/test results, encounter notes, upcoming appointments, etc.  Non-urgent messages can be sent to your provider as well.   To learn more about what you can do with MyChart, go to [email or web address].    Your next appointment:   2 week(s) after Cath  The format for your next appointment:   In Person  Provider:   Berniece Salines, DO     Other Instructions     Adopting a Healthy Lifestyle.  Know what a healthy weight is for you (roughly BMI <25) and aim to maintain this   Aim for 7+ servings of fruits and vegetables daily   65-80+ fluid ounces of water or unsweet tea for healthy kidneys   Limit to max 1 drink of alcohol per day; avoid smoking/tobacco   Limit animal fats in diet for cholesterol and heart health - choose grass fed whenever available   Avoid highly processed foods, and foods high in saturated/trans fats   Aim for low stress - take time to unwind and care for your mental health   Aim for 150 min of moderate intensity exercise weekly for heart health, and weights twice weekly for bone health   Aim for 7-9 hours of sleep daily   When it comes to diets, agreement about the perfect plan isnt easy to find, even among the experts. Experts at the [employer] developed an idea known as the [employer]  Plate. Just imagine a plate divided into logical, healthy portions.   The emphasis is on diet quality:   Load up on vegetables and fruits - one-half of your plate: Aim for color and variety, and remember that potatoes dont count.   Go for whole grains - one-quarter of your plate: Whole  wheat, barley, wheat berries, quinoa, oats, brown rice, and foods made with them. If you want pasta, go with whole wheat pasta.   Protein power - one-quarter of your plate: Fish, chicken, beans, and nuts are all healthy, versatile protein sources. Limit red meat.   The diet, however, does go beyond the plate, offering a few other suggestions.   Use healthy plant oils, such as olive, canola, soy, corn, sunflower and peanut. Check the labels, and avoid partially hydrogenated oil, which have unhealthy trans fats.   If youre thirsty, drink water. Coffee and tea are good in moderation, but skip sugary drinks and limit milk and dairy products to one or two daily servings.   The type of carbohydrate in the diet is more important than the amount. Some sources of carbohydrates, such as vegetables, fruits, whole grains, and beans-are healthier than others.   Finally, stay active  Signed, Berniece Salines, DO  02/23/2022 8:48 PM    [facility]

## 2022-02-23 NOTE — Progress Notes (Signed)
Cardiology Office Note:    Date:  02/23/2022   ID:  AMEYALLI ELICKER, DOB 1961/07/15, MRN [id number]  PCP:  Johny Blamer, MD  Cardiologist:  Thomasene Ripple, DO  Electrophysiologist:  None   Referring MD: Johny Blamer, MD   " I am having chest pain"   History of Present Illness:    Norma [name] is a 61 y.o. female with a hx of Type 2 diabetes , morbid obesity, hyperlipidemia and hypertension here today to be evaluated for chest discomfort.  The patient tells me that over the last several weeks she has had midsternal chest discomfort.  Which feels more like a dull achy pain.  For last Wednesday she had a pain where she had a dull aching sensation which radiated to her jaw and of her shoulder and down her arm.  It lasted for few minutes prior to resolution.  She has had the pain off and on.  And has been concerned because her father had an MI at a young age.  She admits to associated shortness of breath as well.  Past Medical History:  Diagnosis Date   Arthritis    hips   Asthma    Costochondritis    Diabetes (HCC)    GERD (gastroesophageal reflux disease)    Headache    HTN (hypertension)    Hyperlipidemia    Obesity    Palpitations    Paresthesias    on Neurontin   Pinched nerve in neck    pt thinks it's between C3 and C4   PONV (postoperative nausea and vomiting)    PVC (premature ventricular contraction)    Sleep apnea    on CPAP; now moderate.     Past Surgical History:  Procedure Laterality Date   BREAST MASS EXCISION Left    CESAREAN SECTION     CHOLECYSTECTOMY N/A 09/23/2016   Procedure: LAPAROSCOPIC CHOLECYSTECTOMY WITH INTRAOPERATIVE CHOLANGIOGRAM;  Surgeon: Luretha Murphy, MD;  Location: [facility];  Service: General;  Laterality: N/A;   EYE SURGERY     to correct cross sidedness    novasure procedure      RADIOACTIVE SEED GUIDED EXCISIONAL BREAST BIOPSY Left 07/20/2018   Procedure: RADIOACTIVE SEED GUIDED EXCISIONAL BREAST BIOPSY;  Surgeon: Luretha Murphy, MD;   Location: [facility];  Service: General;  Laterality: Left;   TUBAL LIGATION      Current Medications: Current Meds  Medication Sig   albuterol (PROVENTIL HFA;VENTOLIN HFA) 108 (90 Base) MCG/ACT inhaler Inhale 2 puffs into the lungs every 6 (six) hours as needed for wheezing or shortness of breath.   amLODipine (NORVASC) 5 MG tablet Take 5 mg by mouth daily.   aspirin 81 MG tablet Take 81 mg by mouth daily.   cyclobenzaprine (FLEXERIL) 5 MG tablet Take 5 mg by mouth at bedtime as needed for muscle spasms.   escitalopram (LEXAPRO) 5 MG tablet Take 10 mg by mouth daily.    gabapentin (NEURONTIN) 600 MG tablet Take 600 mg by mouth at bedtime.   Lancets (ONETOUCH DELICA PLUS LANCET33G) MISC Apply 1 each topically daily.   metFORMIN (GLUCOPHAGE-XR) 500 MG 24 hr tablet Take 500 mg by mouth daily with breakfast.   metoprolol succinate (TOPROL-XL) 50 MG 24 hr tablet Take 100 mg by mouth every evening.   nitroGLYCERIN (NITROSTAT) 0.4 MG SL tablet Place 1 tablet (0.4 mg total) under the tongue every 5 (five) minutes as needed for chest pain. Up to 3 times.   ONETOUCH VERIO test strip  1 each daily.   VITAMIN D PO Take 5,000 Int'l Units by mouth daily.     Allergies:   Beconase aq [beclomethasone diprop monohyd], Atenolol, Other, Cephalexin, and Codeine   Social History   Socioeconomic History   Marital status: Married    Spouse name: Not on file   Number of children: 4   Years of education: Not on file   Highest education level: Bachelor's degree (e.g., BA, AB, BS)  Occupational History   Not on file  Tobacco Use   Smoking status: Never   Smokeless tobacco: Never  Vaping Use   Vaping Use: Never used  Substance and Sexual Activity   Alcohol use: No   Drug use: No   Sexual activity: Not Currently    Birth control/protection: Post-menopausal  Other Topics Concern   Not on file  Social History Narrative   Lives at home with husband    Right handed   Caffeine: none     Social Determinants of Health   Financial Resource Strain: Not on file  Food Insecurity: Not on file  Transportation Needs: Not on file  Physical Activity: Not on file  Stress: Not on file  Social Connections: Not on file     Family History: The patient's family history includes Heart attack in her father; Multiple sclerosis in her niece; Other in her mother; Stroke in her father.  ROS:   Review of Systems  Constitution: Negative for decreased appetite, fever and weight gain.  HENT: Negative for congestion, ear discharge, hoarse voice and sore throat.   Eyes: Negative for discharge, redness, vision loss in right eye and visual halos.  Cardiovascular: Negative for chest pain, dyspnea on exertion, leg swelling, orthopnea and palpitations.  Respiratory: Negative for cough, hemoptysis, shortness of breath and snoring.   Endocrine: Negative for heat intolerance and polyphagia.  Hematologic/Lymphatic: Negative for bleeding problem. Does not bruise/bleed easily.  Skin: Negative for flushing, nail changes, rash and suspicious lesions.  Musculoskeletal: Negative for arthritis, joint pain, muscle cramps, myalgias, neck pain and stiffness.  Gastrointestinal: Negative for abdominal pain, bowel incontinence, diarrhea and excessive appetite.  Genitourinary: Negative for decreased libido, genital sores and incomplete emptying.  Neurological: Negative for brief paralysis, focal weakness, headaches and loss of balance.  Psychiatric/Behavioral: Negative for altered mental status, depression and suicidal ideas.  Allergic/Immunologic: Negative for HIV exposure and persistent infections.    EKGs/Labs/Other Studies Reviewed:    The following studies were reviewed today:   EKG:  The ekg ordered today demonstrates sinus rhythm, heart rate 64 bpm.  Recent Labs: 02/20/2022: BUN 12; Creatinine, Ser 0.49; Hemoglobin 14.7; Platelets 251; Potassium 3.8; Sodium 139  Recent Lipid Panel    Component  Value Date/Time   TRIG 151 (H) 01/14/2021 2335    Physical Exam:    VS:  BP 120/80 (BP Location: Left Arm, Patient Position: Sitting)   Pulse 64   Ht 5\' 2"  (1.575 m)   Wt 204 lb 9.6 oz (92.8 kg)   LMP  (LMP Unknown)   SpO2 96%   BMI 37.42 kg/m     Wt Readings from Last 3 Encounters:  02/23/22 204 lb 9.6 oz (92.8 kg)  02/20/22 207 lb (93.9 kg)  06/07/21 219 lb (99.3 kg)     GEN: Well nourished, well developed in no acute distress HEENT: Normal NECK: No JVD; No carotid bruits LYMPHATICS: No lymphadenopathy CARDIAC: S1S2 noted,RRR, 2/6 midsystolic ejection murmurs, rubs, gallops RESPIRATORY:  Clear to auscultation without rales, wheezing or rhonchi  ABDOMEN: Soft, non-tender, non-distended, +bowel sounds, no guarding. EXTREMITIES: No edema, No cyanosis, no clubbing MUSCULOSKELETAL:  No deformity  SKIN: Warm and dry NEUROLOGIC:  Alert and oriented x 3, non-focal PSYCHIATRIC:  Normal affect, good insight  ASSESSMENT:    1. SOB (shortness of breath)   2. Fatigue, unspecified type   3. Chest pain of uncertain etiology   4. Hyperlipidemia, unspecified hyperlipidemia type   5. OSA (obstructive sleep apnea)   6. Medication management    PLAN:     1.  Her pain does sound typical for angina and she has significant risk factors for coronary artery disease include premature family history, diabetes mellitus, hyperlipidemia, hypertension.  Intermediate to high rates it would be best to move forward with ischemic evaluation in this patient as well as in the setting of her OSA and significant shortness of breath we will be able to get information on her right-sided heart pressures.  A heart catheterization (right and left heart catheterization) at this time would be the most appropriate testing.  I have discussed with the patient about that she is agreeable to proceed.  The patient understands that risks include but are not limited to stroke (1 in 1000), death (1 in 1000), kidney  failure [usually temporary] (1 in 500), bleeding (1 in 200), allergic reaction [possibly serious] (1 in 200), and agrees to proceed.  Again echocardiogram to assess for any aortic stenosis or sclerosis.  Hypertension-blood pressure is acceptable, continue with current antihypertensive regimen.  Hyperlipidemia - continue with current statin medication.  The patient understands the need to lose weight with diet and exercise. We have discussed specific strategies for this.  Continue CPAP  The patient is in agreement with the above plan. The patient left the office in stable condition.  The patient will follow up in   Medication Adjustments/Labs and Tests Ordered: Current medicines are reviewed at length with the patient today.  Concerns regarding medicines are outlined above.  Orders Placed This Encounter  Procedures   Basic Metabolic Panel (BMET)   Magnesium   CBC with Differential/Platelet   EKG 12-Lead   ECHOCARDIOGRAM COMPLETE   Meds ordered this encounter  Medications   nitroGLYCERIN (NITROSTAT) 0.4 MG SL tablet    Sig: Place 1 tablet (0.4 mg total) under the tongue every 5 (five) minutes as needed for chest pain. Up to 3 times.    Dispense:  90 tablet    Refill:  3    Patient Instructions  Medication Instructions:  Your physician has recommended you make the following change in your medication:  START: Nitroglycerin 0.4 mg take one tablet by mouth every 5 minutes up to three times as needed for chest pain  *If you need a refill on your cardiac medications before your next appointment, please call your pharmacy*   Lab Work: Your physician recommends that you return for lab work in:  TODAY: BMET, Mag, CBC  If you have labs (blood work) drawn today and your tests are completely normal, you will receive your results only by: MyChart Message (if you have MyChart) OR A paper copy in the mail If you have any lab test that is abnormal or we need to change your treatment, we  will call you to review the results.   Testing/Procedures:  [facility] CARDIOVASCULAR DIVISION [facility] [address] [place] [address] Dept: [phone] Loc: [phone]  SHARLET STEINBRECHER  02/23/2022  You are scheduled for a Cardiac Catheterization  on Wednesday, March 8 with Dr. Alverda Skeans.  1. Please arrive at the [facility] (Main Entrance A) at [facility]: [address] [place], [place] [address] at 8:00 AM (This time is two hours before your procedure to ensure your preparation). Free valet parking service is available.   Special note: Every effort is made to have your procedure done on time. Please understand that emergencies sometimes delay scheduled procedures.  2. Diet: Do not eat solid foods after midnight.  The patient may have clear liquids until 5am upon the day of the procedure.  3. Labs: You will need to have blood drawn on TODAY.  4. Medication instructions in preparation for your procedure:   Contrast Allergy: No   On the morning of your procedure, take your Aspirin and any morning medicines NOT listed above.  You may use sips of water.  5. Plan for one night stay--bring personal belongings. 6. Bring a current list of your medications and current insurance cards. 7. You MUST have a responsible person to drive you home. 8. Someone MUST be with you the first 24 hours after you arrive home or your discharge will be delayed. 9. Please wear clothes that are easy to get on and off and wear slip-on shoes.  Thank you for allowing [place] to care for you!   -- [facility]    Follow-Up: At [facility], you and your health needs are our priority.  As part of our continuing mission to provide you with exceptional heart care, we have created designated Provider Care Teams.  These Care Teams include your primary Cardiologist (physician) and Advanced Practice Providers  (APPs -  Physician Assistants and Nurse Practitioners) who all work together to provide you with the care you need, when you need it.  We recommend signing up for the patient portal called "MyChart".  Sign up information is provided on this After Visit Summary.  MyChart is used to connect with patients for Virtual Visits (Telemedicine).  Patients are able to view lab/test results, encounter notes, upcoming appointments, etc.  Non-urgent messages can be sent to your provider as well.   To learn more about what you can do with MyChart, go to [email or web address].    Your next appointment:   2 week(s) after Cath  The format for your next appointment:   In Person  Provider:   Thomasene Ripple, DO     Other Instructions     Adopting a Healthy Lifestyle.  Know what a healthy weight is for you (roughly BMI <25) and aim to maintain this   Aim for 7+ servings of fruits and vegetables daily   65-80+ fluid ounces of water or unsweet tea for healthy kidneys   Limit to max 1 drink of alcohol per day; avoid smoking/tobacco   Limit animal fats in diet for cholesterol and heart health - choose grass fed whenever available   Avoid highly processed foods, and foods high in saturated/trans fats   Aim for low stress - take time to unwind and care for your mental health   Aim for 150 min of moderate intensity exercise weekly for heart health, and weights twice weekly for bone health   Aim for 7-9 hours of sleep daily   When it comes to diets, agreement about the perfect plan isnt easy to find, even among the experts. Experts at the [facility] of [employer] developed an idea known as the Healthy Eating Plate. Just imagine a plate divided  into logical, healthy portions.   The emphasis is on diet quality:   Load up on vegetables and fruits - one-half of your plate: Aim for color and variety, and remember that potatoes dont count.   Go for whole grains - one-quarter of your plate: Whole  wheat, barley, wheat berries, quinoa, oats, brown rice, and foods made with them. If you want pasta, go with whole wheat pasta.   Protein power - one-quarter of your plate: Fish, chicken, beans, and nuts are all healthy, versatile protein sources. Limit red meat.   The diet, however, does go beyond the plate, offering a few other suggestions.   Use healthy plant oils, such as olive, canola, soy, corn, sunflower and peanut. Check the labels, and avoid partially hydrogenated oil, which have unhealthy trans fats.   If youre thirsty, drink water. Coffee and tea are good in moderation, but skip sugary drinks and limit milk and dairy products to one or two daily servings.   The type of carbohydrate in the diet is more important than the amount. Some sources of carbohydrates, such as vegetables, fruits, whole grains, and beans-are healthier than others.   Finally, stay active  Signed, Thomasene Ripple, DO  02/23/2022 8:48 PM    [facility]

## 2022-02-23 NOTE — Patient Instructions (Addendum)
Medication Instructions:  ?Your physician has recommended you make the following change in your medication:  ?START: Nitroglycerin 0.4 mg take one tablet by mouth every 5 minutes up to three times as needed for chest pain ? ?*If you need a refill on your cardiac medications before your next appointment, please call your pharmacy* ? ? ?Lab Work: ?Your physician recommends that you return for lab work in:  ?TODAY: BMET, Mag, CBC ? ?If you have labs (blood work) drawn today and your tests are completely normal, you will receive your results only by: ?MyChart Message (if you have MyChart) OR ?A paper copy in the mail ?If you have any lab test that is abnormal or we need to change your treatment, we will call you to review the results. ? ? ?Testing/Procedures: ? ?[facility] CARDIOVASCULAR DIVISION ?[facility] ?[address] ?[place] [address] ?Dept: [phone] ?Loc: [phone] ? ?Norma [name]  02/23/2022 ? ?You are scheduled for a Cardiac Catheterization on Wednesday, March 8 with Dr. Alverda Skeans. ? ?1. Please arrive at the [facility] (Main Entrance A) at [facility]: [address] [place], [place] [address] at 8:00 AM (This time is two hours before your procedure to ensure your preparation). Free valet parking service is available.  ? ?Special note: Every effort is made to have your procedure done on time. Please understand that emergencies sometimes delay scheduled procedures. ? ?2. Diet: Do not eat solid foods after midnight.  The patient may have clear liquids until 5am upon the day of the procedure. ? ?3. Labs: You will need to have blood drawn on TODAY. ? ?4. Medication instructions in preparation for your procedure: ? ? Contrast Allergy: No ? ? ?On the morning of your procedure, take your Aspirin and any morning medicines NOT listed above.  You may use sips of water. ? ?5. Plan for one night stay--bring personal belongings. ?6. Bring a  current list of your medications and current insurance cards. ?7. You MUST have a responsible person to drive you home. ?8. Someone MUST be with you the first 24 hours after you arrive home or your discharge will be delayed. ?9. Please wear clothes that are easy to get on and off and wear slip-on shoes. ? ?Thank you for allowing [place] to care for you! ?  -- [facility] ? ? ? ?Follow-Up: ?At [facility], you and your health needs are our priority.  As part of our continuing mission to provide you with exceptional heart care, we have created designated Provider Care Teams.  These Care Teams include your primary Cardiologist (physician) and Advanced Practice Providers (APPs -  Physician Assistants and Nurse Practitioners) who all work together to provide you with the care you need, when you need it. ? ?We recommend signing up for the patient portal called "MyChart".  Sign up information is provided on this After Visit Summary.  MyChart is used to connect with patients for Virtual Visits (Telemedicine).  Patients are able to view lab/test results, encounter notes, upcoming appointments, etc.  Non-urgent messages can be sent to your provider as well.   ?To learn more about what you can do with MyChart, go to [email or web address].   ? ?Your next appointment:   ?2 week(s) after Cath ? ?The format for your next appointment:   ?In Person ? ?Provider:   ?Thomasene Ripple, DO   ? ? ?Other Instructions ?  ?

## 2022-02-24 LAB — CBC WITH DIFFERENTIAL/PLATELET
Basophils Absolute: 0.1 10*3/uL (ref 0.0–0.2)
Basos: 1 %
EOS (ABSOLUTE): 0.2 10*3/uL (ref 0.0–0.4)
Eos: 2 %
Hematocrit: 42.6 % (ref 34.0–46.6)
Hemoglobin: 15.1 g/dL (ref 11.1–15.9)
Immature Grans (Abs): 0 10*3/uL (ref 0.0–0.1)
Immature Granulocytes: 0 %
Lymphocytes Absolute: 2.1 10*3/uL (ref 0.7–3.1)
Lymphs: 22 %
MCH: 29.3 pg (ref 26.6–33.0)
MCHC: 35.4 g/dL (ref 31.5–35.7)
MCV: 83 fL (ref 79–97)
Monocytes Absolute: 0.6 10*3/uL (ref 0.1–0.9)
Monocytes: 6 %
Neutrophils Absolute: 6.7 10*3/uL (ref 1.4–7.0)
Neutrophils: 69 %
Platelets: 248 10*3/uL (ref 150–450)
RBC: 5.15 x10E6/uL (ref 3.77–5.28)
RDW: 14.3 % (ref 11.7–15.4)
WBC: 9.6 10*3/uL (ref 3.4–10.8)

## 2022-02-24 LAB — BASIC METABOLIC PANEL
BUN/Creatinine Ratio: 23 (ref 12–28)
BUN: 11 mg/dL (ref 8–27)
CO2: 21 mmol/L (ref 20–29)
Calcium: 10 mg/dL (ref 8.7–10.3)
Chloride: 100 mmol/L (ref 96–106)
Creatinine, Ser: 0.48 mg/dL — ABNORMAL LOW (ref 0.57–1.00)
Glucose: 95 mg/dL (ref 70–99)
Potassium: 4 mmol/L (ref 3.5–5.2)
Sodium: 142 mmol/L (ref 134–144)
eGFR: 108 mL/min/{1.73_m2} (ref >59)

## 2022-02-24 LAB — MAGNESIUM: Magnesium: 2.2 mg/dL (ref 1.6–2.3)

## 2022-02-28 ENCOUNTER — Telehealth: Payer: Self-pay | Admitting: *Deleted

## 2022-02-28 NOTE — Telephone Encounter (Signed)
Cardiac catheterization scheduled at [facility] for: Wednesday March 02, 2022 10 AM ?[facility] Main Entrance A [facility]) at: 8 AM ? ? ?Diet-no solid food after midnight prior to cath, clear liquids until 5 AM day of procedure. ? ?Medication instructions for procedure: ?-Hold: ? Metformin-day of procedure and 48 hours post procedure ?-Except hold medications usual morning medications can be taken pre-cath with sips of water including aspirin 81 mg. ?   ?Must have responsible adult to drive home post procedure and be with patient first 24 hours after arriving home. ? ?[facility] does allow one visitor to wait in the waiting room during the time you are there. ? ? ?Patient reports does not currently have any new symptoms concerning for COVID-19 and no household members with COVID-19 like illness.  ? ? ? ? ?  Reviewed procedure instructions with patient. ? ? ? ? ?

## 2022-03-02 ENCOUNTER — Ambulatory Visit (HOSPITAL_COMMUNITY)
Admission: RE | Admit: 2022-03-02 | Discharge: 2022-03-02 | Disposition: A | Discharge status: Home / Self Care | Payer: Managed Care, Other (non HMO) | Attending: Internal Medicine | Admitting: Internal Medicine

## 2022-03-02 ENCOUNTER — Encounter (HOSPITAL_COMMUNITY)
Admission: RE | Disposition: A | Discharge status: Home / Self Care | Payer: Self-pay | Source: Home / Self Care | Attending: Internal Medicine

## 2022-03-02 ENCOUNTER — Other Ambulatory Visit: Payer: Self-pay

## 2022-03-02 DIAGNOSIS — K219 Gastro-esophageal reflux disease without esophagitis: Secondary | ICD-10-CM | POA: Diagnosis not present

## 2022-03-02 DIAGNOSIS — Z7982 Long term (current) use of aspirin: Secondary | ICD-10-CM | POA: Diagnosis not present

## 2022-03-02 DIAGNOSIS — E119 Type 2 diabetes mellitus without complications: Secondary | ICD-10-CM | POA: Insufficient documentation

## 2022-03-02 DIAGNOSIS — Z79899 Other long term (current) drug therapy: Secondary | ICD-10-CM | POA: Insufficient documentation

## 2022-03-02 DIAGNOSIS — Z8249 Family history of ischemic heart disease and other diseases of the circulatory system: Secondary | ICD-10-CM | POA: Diagnosis not present

## 2022-03-02 DIAGNOSIS — E785 Hyperlipidemia, unspecified: Secondary | ICD-10-CM | POA: Insufficient documentation

## 2022-03-02 DIAGNOSIS — I208 Other forms of angina pectoris: Secondary | ICD-10-CM | POA: Insufficient documentation

## 2022-03-02 DIAGNOSIS — I10 Essential (primary) hypertension: Secondary | ICD-10-CM | POA: Diagnosis not present

## 2022-03-02 DIAGNOSIS — G4733 Obstructive sleep apnea (adult) (pediatric): Secondary | ICD-10-CM | POA: Insufficient documentation

## 2022-03-02 DIAGNOSIS — Z7984 Long term (current) use of oral hypoglycemic drugs: Secondary | ICD-10-CM | POA: Insufficient documentation

## 2022-03-02 DIAGNOSIS — R0602 Shortness of breath: Secondary | ICD-10-CM | POA: Insufficient documentation

## 2022-03-02 DIAGNOSIS — Z6837 Body mass index (BMI) 37.0-37.9, adult: Secondary | ICD-10-CM | POA: Diagnosis not present

## 2022-03-02 HISTORY — PX: RIGHT/LEFT HEART CATH AND CORONARY ANGIOGRAPHY: CATH118266

## 2022-03-02 LAB — POCT I-STAT 7, (LYTES, BLD GAS, ICA,H+H)
Acid-Base Excess: 1 mmol/L (ref 0.0–2.0)
Bicarbonate: 25.5 mmol/L (ref 20.0–28.0)
Calcium, Ion: 1.2 mmol/L (ref 1.15–1.40)
HCT: 38 % (ref 36.0–46.0)
Hemoglobin: 12.9 g/dL (ref 12.0–15.0)
O2 Saturation: 93 %
Potassium: 3.7 mmol/L (ref 3.5–5.1)
Sodium: 142 mmol/L (ref 135–145)
TCO2: 27 mmol/L (ref 22–32)
pCO2 arterial: 40.4 mmHg (ref 32–48)
pH, Arterial: 7.408 (ref 7.35–7.45)
pO2, Arterial: 65 mmHg — ABNORMAL LOW (ref 83–108)

## 2022-03-02 LAB — POCT I-STAT EG7
Acid-Base Excess: 1 mmol/L (ref 0.0–2.0)
Acid-Base Excess: 1 mmol/L (ref 0.0–2.0)
Bicarbonate: 25.9 mmol/L (ref 20.0–28.0)
Bicarbonate: 26.4 mmol/L (ref 20.0–28.0)
Calcium, Ion: 1.12 mmol/L — ABNORMAL LOW (ref 1.15–1.40)
Calcium, Ion: 1.16 mmol/L (ref 1.15–1.40)
HCT: 37 % (ref 36.0–46.0)
HCT: 38 % (ref 36.0–46.0)
Hemoglobin: 12.6 g/dL (ref 12.0–15.0)
Hemoglobin: 12.9 g/dL (ref 12.0–15.0)
O2 Saturation: 72 %
O2 Saturation: 72 %
Potassium: 3.5 mmol/L (ref 3.5–5.1)
Potassium: 3.6 mmol/L (ref 3.5–5.1)
Sodium: 142 mmol/L (ref 135–145)
Sodium: 143 mmol/L (ref 135–145)
TCO2: 27 mmol/L (ref 22–32)
TCO2: 28 mmol/L (ref 22–32)
pCO2, Ven: 41.7 mmHg — ABNORMAL LOW (ref 44–60)
pCO2, Ven: 43.8 mmHg — ABNORMAL LOW (ref 44–60)
pH, Ven: 7.388 (ref 7.25–7.43)
pH, Ven: 7.401 (ref 7.25–7.43)
pO2, Ven: 38 mmHg (ref 32–45)
pO2, Ven: 39 mmHg (ref 32–45)

## 2022-03-02 LAB — GLUCOSE, CAPILLARY: Glucose-Capillary: 144 mg/dL — ABNORMAL HIGH (ref 70–99)

## 2022-03-02 SURGERY — RIGHT/LEFT HEART CATH AND CORONARY ANGIOGRAPHY
Anesthesia: LOCAL

## 2022-03-02 MED ORDER — SODIUM CHLORIDE 0.9 % WEIGHT BASED INFUSION: 1.0000 mL/kg/h | INTRAVENOUS | Status: DC

## 2022-03-02 MED ORDER — ACETAMINOPHEN 325 MG PO TABS: 650.0000 mg | ORAL_TABLET | ORAL | Status: DC | PRN

## 2022-03-02 MED ORDER — ASPIRIN 81 MG PO CHEW: 81.0000 mg | CHEWABLE_TABLET | ORAL | Status: DC

## 2022-03-02 MED ORDER — LIDOCAINE HCL (PF) 1 % IJ SOLN
INTRAMUSCULAR | Status: DC | PRN
  Administered 2022-03-02: 5 mL

## 2022-03-02 MED ORDER — VERAPAMIL HCL 2.5 MG/ML IV SOLN
INTRAVENOUS | Status: DC | PRN
  Administered 2022-03-02: 10 mL via INTRA_ARTERIAL

## 2022-03-02 MED ORDER — SODIUM CHLORIDE 0.9% FLUSH: 3.0000 mL | Freq: Two times a day (BID) | INTRAVENOUS | Status: DC

## 2022-03-02 MED ORDER — IOHEXOL 350 MG/ML SOLN
INTRAVENOUS | Status: DC | PRN
  Administered 2022-03-02: 45 mL

## 2022-03-02 MED ORDER — HYDRALAZINE HCL 20 MG/ML IJ SOLN: 10.0000 mg | INTRAMUSCULAR | Status: DC | PRN

## 2022-03-02 MED ORDER — SODIUM CHLORIDE 0.9% FLUSH: 3.0000 mL | INTRAVENOUS | Status: DC | PRN

## 2022-03-02 MED ORDER — FENTANYL CITRATE (PF) 100 MCG/2ML IJ SOLN
INTRAMUSCULAR | Status: DC | PRN
  Administered 2022-03-02: 25 ug via INTRAVENOUS

## 2022-03-02 MED ORDER — SODIUM CHLORIDE 0.9 % IV SOLN: 250.0000 mL | INTRAVENOUS | Status: DC | PRN

## 2022-03-02 MED ORDER — MIDAZOLAM HCL 2 MG/2ML IJ SOLN
INTRAMUSCULAR | Status: DC | PRN
  Administered 2022-03-02: 1 mg via INTRAVENOUS

## 2022-03-02 MED ORDER — LABETALOL HCL 5 MG/ML IV SOLN: 10.0000 mg | INTRAVENOUS | Status: DC | PRN

## 2022-03-02 MED ORDER — HEPARIN SODIUM (PORCINE) 1000 UNIT/ML IJ SOLN
INTRAMUSCULAR | Status: AC
  Filled 2022-03-02: qty 10

## 2022-03-02 MED ORDER — SODIUM CHLORIDE 0.9 % WEIGHT BASED INFUSION: 3.0000 mL/kg/h | INTRAVENOUS | Status: DC

## 2022-03-02 MED ORDER — SODIUM CHLORIDE 0.9 % IV SOLN
INTRAVENOUS | Status: DC
Start: 2022-03-02 — End: 2022-03-02

## 2022-03-02 MED ORDER — FENTANYL CITRATE (PF) 100 MCG/2ML IJ SOLN
INTRAMUSCULAR | Status: AC
  Filled 2022-03-02: qty 2

## 2022-03-02 MED ORDER — HEPARIN (PORCINE) IN NACL 1000-0.9 UT/500ML-% IV SOLN
INTRAVENOUS | Status: AC
  Filled 2022-03-02: qty 1000

## 2022-03-02 MED ORDER — VERAPAMIL HCL 2.5 MG/ML IV SOLN
INTRAVENOUS | Status: AC
  Filled 2022-03-02: qty 2

## 2022-03-02 MED ORDER — HEPARIN SODIUM (PORCINE) 1000 UNIT/ML IJ SOLN
INTRAMUSCULAR | Status: DC | PRN
  Administered 2022-03-02: 5000 [IU] via INTRAVENOUS

## 2022-03-02 MED ORDER — LIDOCAINE HCL (PF) 1 % IJ SOLN
INTRAMUSCULAR | Status: AC
  Filled 2022-03-02: qty 30

## 2022-03-02 MED ORDER — MIDAZOLAM HCL 2 MG/2ML IJ SOLN
INTRAMUSCULAR | Status: AC
  Filled 2022-03-02: qty 2

## 2022-03-02 MED ORDER — HEPARIN (PORCINE) IN NACL 1000-0.9 UT/500ML-% IV SOLN
INTRAVENOUS | Status: DC | PRN
  Administered 2022-03-02 (×3): 500 mL

## 2022-03-02 MED ORDER — ONDANSETRON HCL 4 MG/2ML IJ SOLN: 4.0000 mg | Freq: Four times a day (QID) | INTRAMUSCULAR | Status: DC | PRN

## 2022-03-02 SURGICAL SUPPLY — 13 items
CATH BALLN WEDGE 5F 110CM (CATHETERS) ×1 IMPLANT
CATH INFINITI 6F ANG MULTIPACK (CATHETERS) ×1 IMPLANT
CATH INFINITI 6F FL3.5 (CATHETERS) ×1 IMPLANT
DEVICE RAD COMP TR BAND LRG (VASCULAR PRODUCTS) ×1 IMPLANT
GLIDESHEATH SLEND SS 6F .021 (SHEATH) ×1 IMPLANT
GUIDEWIRE INQWIRE 1.5J.035X260 (WIRE) IMPLANT
INQWIRE 1.5J .035X260CM (WIRE) ×2
KIT HEART LEFT (KITS) ×2 IMPLANT
PACK CARDIAC CATHETERIZATION (CUSTOM PROCEDURE TRAY) ×2 IMPLANT
SHEATH GLIDE SLENDER 4/5FR (SHEATH) ×1 IMPLANT
SHEATH PROBE COVER 6X72 (BAG) ×1 IMPLANT
TRANSDUCER W/STOPCOCK (MISCELLANEOUS) ×2 IMPLANT
TUBING CIL FLEX 10 FLL-RA (TUBING) ×2 IMPLANT

## 2022-03-02 NOTE — Interval H&P Note (Signed)
History and Physical Interval Note: ? ?03/02/2022 ?10:18 AM ? ?Norma [name]  has presented today for surgery, with the diagnosis of chest pain, shortness of breath.  The various methods of treatment have been discussed with the patient and family. After consideration of risks, benefits and other options for treatment, the patient has consented to  Procedure(s): ?RIGHT/LEFT HEART CATH AND CORONARY ANGIOGRAPHY (N/A) as a surgical intervention.  The patient's history has been reviewed, patient examined, no change in status, stable for surgery.  I have reviewed the patient's chart and labs.  Questions were answered to the patient's satisfaction.   ? ?Cath Lab Visit (complete for each Cath Lab visit) ? ?Clinical Evaluation Leading to the Procedure:  ? ?ACS: No. ? ?Non-ACS:   ? ?Anginal Classification: CCS II ? ?Anti-ischemic medical therapy: Maximal Therapy (2 or more classes of medications) ? ?Non-Invasive Test Results: No non-invasive testing performed ? ?Prior CABG: No previous CABG ? ? ? ? ? ? ? ?Orbie Pyo ? ? ?

## 2022-03-02 NOTE — Discharge Instructions (Signed)
Restart metformin 3/10 ?

## 2022-03-03 ENCOUNTER — Encounter (HOSPITAL_COMMUNITY): Payer: Self-pay | Admitting: Internal Medicine

## 2022-03-07 ENCOUNTER — Other Ambulatory Visit: Payer: Self-pay

## 2022-03-07 ENCOUNTER — Ambulatory Visit (HOSPITAL_COMMUNITY): Payer: Managed Care, Other (non HMO) | Attending: Internal Medicine

## 2022-03-07 DIAGNOSIS — I083 Combined rheumatic disorders of mitral, aortic and tricuspid valves: Secondary | ICD-10-CM

## 2022-03-07 DIAGNOSIS — R0602 Shortness of breath: Secondary | ICD-10-CM

## 2022-03-07 LAB — ECHOCARDIOGRAM COMPLETE
AR max vel: 3.1 cm2
AV Area VTI: 3.01 cm2
AV Area mean vel: 3.04 cm2
AV Mean grad: 7 mmHg
AV Peak grad: 13.3 mmHg
Ao pk vel: 1.82 m/s
Area-P 1/2: 3.6 cm2
S' Lateral: 3.5 cm

## 2022-03-16 ENCOUNTER — Ambulatory Visit: Payer: Managed Care, Other (non HMO) | Admitting: Cardiology

## 2022-03-16 ENCOUNTER — Encounter: Payer: Self-pay | Admitting: Cardiology

## 2022-03-16 ENCOUNTER — Ambulatory Visit (INDEPENDENT_AMBULATORY_CARE_PROVIDER_SITE_OTHER): Payer: Managed Care, Other (non HMO)

## 2022-03-16 ENCOUNTER — Other Ambulatory Visit: Payer: Self-pay

## 2022-03-16 VITALS — Ht 62.0 in | Wt 206.6 lb

## 2022-03-16 DIAGNOSIS — I10 Essential (primary) hypertension: Secondary | ICD-10-CM

## 2022-03-16 DIAGNOSIS — R002 Palpitations: Secondary | ICD-10-CM | POA: Diagnosis not present

## 2022-03-16 DIAGNOSIS — I493 Ventricular premature depolarization: Secondary | ICD-10-CM

## 2022-03-16 DIAGNOSIS — E782 Mixed hyperlipidemia: Secondary | ICD-10-CM

## 2022-03-16 DIAGNOSIS — R0609 Other forms of dyspnea: Secondary | ICD-10-CM

## 2022-03-16 DIAGNOSIS — R0789 Other chest pain: Secondary | ICD-10-CM

## 2022-03-16 MED ORDER — LIDOCAINE 5 % EX PTCH: 1.0000 | MEDICATED_PATCH | CUTANEOUS | 0 refills | Status: DC

## 2022-03-16 NOTE — Progress Notes (Signed)
?Cardiology Office Note:   ? ?Date:  03/16/2022  ? ?ID:  Norma [name], DOB 01/15/1961, MRN [id number] ? ?PCP:  Johny BlamerHarris, William, MD  ?Cardiologist:  Thomasene RippleKardie Guenevere Roorda, DO  ?Electrophysiologist:  None  ? ?Referring MD: Johny BlamerHarris, William, MD  ? ?" I am still short of breath" ? ?History of Present Illness:   ? ?Norma [name] is a 61 y.o. female with a hx of type 2 diabetes, morbid obesity, hyperlipidemia, hypertension, history of COVID-19 infection now 2 episodes of COVID first in 2022 which led to chronic respiratory failure where she needed oxygen and was eventually titrated off oxygen, long COVID syndrome, OSA on CPAP. ? ?I first saw the patient on February 23, 2022 at that time she was presented chest discomfort.  She was concerned about her chest discomfort as well as her shortness of breath her symptoms sound is typical and given the fact that she had significant risk factors we proceeded with a right and left heart catheterization.  She did get her catheterization which was completely normal. ? ?Today she still is experiencing some chest discomfort now, she describes it as a sensation when happens sometimes when she is doing activity.  she feels it in her Chest area and sometimes moving different location.  Nothing makes it better or worse. ? ?Her shortness of breath has not improved at all.  She is concerned about this.  In addition she tells me that she is not experiencing significant palpitations.  Onset was a few minutes and then resolved.  Nothing makes it better or worse. ? ?Past Medical History:  ?Diagnosis Date  ? Arthritis   ? hips  ? Asthma   ? Costochondritis   ? Diabetes (HCC)   ? GERD (gastroesophageal reflux disease)   ? Headache   ? HTN (hypertension)   ? Hyperlipidemia   ? Obesity   ? Palpitations   ? Paresthesias   ? on Neurontin  ? Pinched nerve in neck   ? pt thinks it's between C3 and C4  ? PONV (postoperative nausea and vomiting)   ? PVC (premature ventricular contraction)   ? Sleep apnea   ? on CPAP;  now moderate.   ? ? ?Past Surgical History:  ?Procedure Laterality Date  ? BREAST MASS EXCISION Left   ? CESAREAN SECTION    ? CHOLECYSTECTOMY N/A 09/23/2016  ? Procedure: LAPAROSCOPIC CHOLECYSTECTOMY WITH INTRAOPERATIVE CHOLANGIOGRAM;  Surgeon: Luretha MurphyMatthew Martin, MD;  Location: [facility];  Service: General;  Laterality: N/A;  ? EYE SURGERY    ? to correct cross sidedness   ? novasure procedure     ? RADIOACTIVE SEED GUIDED EXCISIONAL BREAST BIOPSY Left 07/20/2018  ? Procedure: RADIOACTIVE SEED GUIDED EXCISIONAL BREAST BIOPSY;  Surgeon: Luretha MurphyMartin, Matthew, MD;  Location: [facility];  Service: General;  Laterality: Left;  ? RIGHT/LEFT HEART CATH AND CORONARY ANGIOGRAPHY N/A 03/02/2022  ? Procedure: RIGHT/LEFT HEART CATH AND CORONARY ANGIOGRAPHY;  Surgeon: Orbie Pyohukkani, Arun K, MD;  Location: [facility];  Service: Cardiovascular;  Laterality: N/A;  ? TUBAL LIGATION    ? ? ?Current Medications: ?Current Meds  ?Medication Sig  ? albuterol (PROVENTIL HFA;VENTOLIN HFA) 108 (90 Base) MCG/ACT inhaler Inhale 2 puffs into the lungs every 6 (six) hours as needed for wheezing or shortness of breath.  ? amLODipine (NORVASC) 5 MG tablet Take 5 mg by mouth daily.  ? Ascorbic Acid (VITAMIN C) 1000 MG tablet Take 1,000 mg by mouth daily.  ? aspirin 81 MG tablet Take  81 mg by mouth daily.  ? Cholecalciferol (DIALYVITE VITAMIN D 5000) 125 MCG (5000 UT) capsule Take 5,000 Units by mouth daily.  ? cyclobenzaprine (FLEXERIL) 5 MG tablet Take 5 mg by mouth at bedtime as needed for muscle spasms.  ? Digestive Enzyme CAPS Take 1 capsule by mouth 3 (three) times daily before meals.  ? escitalopram (LEXAPRO) 5 MG tablet Take 10 mg by mouth daily.   ? gabapentin (NEURONTIN) 600 MG tablet Take 600 mg by mouth at bedtime.  ? Lancets (ONETOUCH DELICA PLUS LANCET33G) MISC Apply 1 each topically daily.  ? lidocaine (LIDODERM) 5 % Place 1 patch onto the skin daily. Remove & Discard patch within 12 hours or as directed by MD  ? metFORMIN  (GLUCOPHAGE-XR) 500 MG 24 hr tablet Take 1,000 mg by mouth daily with breakfast.  ? metoprolol succinate (TOPROL-XL) 50 MG 24 hr tablet Take 100 mg by mouth every evening.  ? nitroGLYCERIN (NITROSTAT) 0.4 MG SL tablet Place 1 tablet (0.4 mg total) under the tongue every 5 (five) minutes as needed for chest pain. Up to 3 times.  ? ONETOUCH VERIO test strip 1 each daily.  ? OVER THE COUNTER MEDICATION Take 1 capsule by mouth with breakfast, with lunch, and with evening meal. lipo-gen  ? OVER THE COUNTER MEDICATION Take 1 Scoop by mouth 2 (two) times daily with a meal. Lauricidin  ? OVER THE COUNTER MEDICATION Take 4 capsules by mouth daily. thyrotain  ? OVER THE COUNTER MEDICATION Take 2 capsules by mouth 2 (two) times daily. silymarin forte  ? OVER THE COUNTER MEDICATION Take 1 capsule by mouth daily. omegagenics mega 10  ? OVER THE COUNTER MEDICATION Take 2 capsules by mouth daily with lunch. blood sugar support  ? OVER THE COUNTER MEDICATION Take 1 Scoop by mouth daily. glutashield  ? valsartan (DIOVAN) 320 MG tablet Take 320 mg by mouth daily.  ?  ? ?Allergies:   Beconase aq [beclomethasone diprop monohyd], Atenolol, Other, Cephalexin, and Codeine  ? ?Social History  ? ?Socioeconomic History  ? Marital status: Married  ?  Spouse name: Not on file  ? Number of children: 4  ? Years of education: Not on file  ? Highest education level: Bachelor's degree (e.g., BA, AB, BS)  ?Occupational History  ? Not on file  ?Tobacco Use  ? Smoking status: Never  ? Smokeless tobacco: Never  ?Vaping Use  ? Vaping Use: Never used  ?Substance and Sexual Activity  ? Alcohol use: No  ? Drug use: No  ? Sexual activity: Not Currently  ?  Birth control/protection: Post-menopausal  ?Other Topics Concern  ? Not on file  ?Social History Narrative  ? Lives at home with husband   ? Right handed  ? Caffeine: none   ? ?Social Determinants of Health  ? ?Financial Resource Strain: Not on file  ?Food Insecurity: Not on file  ?Transportation Needs:  Not on file  ?Physical Activity: Not on file  ?Stress: Not on file  ?Social Connections: Not on file  ?  ? ?Family History: ?The patient's family history includes Heart attack in her father; Multiple sclerosis in her niece; Other in her mother; Stroke in her father. ? ?ROS:   ?Review of Systems  ?Constitution: Negative for decreased appetite, fever and weight gain.  ?HENT: Negative for congestion, ear discharge, hoarse voice and sore throat.   ?Eyes: Negative for discharge, redness, vision loss in right eye and visual halos.  ?Cardiovascular: Reports chest pain, dyspnea on exertion,  palpitation.  Negative for leg swelling, orthopnea. ?Respiratory: Negative for cough, hemoptysis, shortness of breath and snoring.   ?Endocrine: Negative for heat intolerance and polyphagia.  ?Hematologic/Lymphatic: Negative for bleeding problem. Does not bruise/bleed easily.  ?Skin: Negative for flushing, nail changes, rash and suspicious lesions.  ?Musculoskeletal: Negative for arthritis, joint pain, muscle cramps, myalgias, neck pain and stiffness.  ?Gastrointestinal: Negative for abdominal pain, bowel incontinence, diarrhea and excessive appetite.  ?Genitourinary: Negative for decreased libido, genital sores and incomplete emptying.  ?Neurological: Negative for brief paralysis, focal weakness, headaches and loss of balance.  ?Psychiatric/Behavioral: Negative for altered mental status, depression and suicidal ideas.  ?Allergic/Immunologic: Negative for HIV exposure and persistent infections.  ? ? ?EKGs/Labs/Other Studies Reviewed:   ? ?The following studies were reviewed today: ? ? ?EKG:  None today  ? ? TTE 03/07/2022 IMPRESSIONS  ? 1. Left ventricular ejection fraction, by estimation, is 60 to 65%. The  ?left ventricle has normal function. The left ventricle has no regional  ?wall motion abnormalities. Left ventricular diastolic parameters are  ?consistent with Grade I diastolic  ?dysfunction (impaired relaxation). The average left  ventricular global  ?longitudinal strain is -24.9 %. The global longitudinal strain is normal.  ? 2. Right ventricular systolic function is normal. The right ventricular  ?size is normal. Tricuspid regurgitat

## 2022-03-16 NOTE — Patient Instructions (Addendum)
Medication Instructions:  ?Your physician has recommended you make the following change in your medication:  ?Lidocaine 5% patches apply 1 patch to area once every 24 hours ?*If you need a refill on your cardiac medications before your next appointment, please call your pharmacy* ? ? ?Lab Work: ?None ?If you have labs (blood work) drawn today and your tests are completely normal, you will receive your results only by: ?MyChart Message (if you have MyChart) OR ?A paper copy in the mail ?If you have any lab test that is abnormal or we need to change your treatment, we will call you to review the results. ? ? ?Testing/Procedures: ?ZIO XT- Long Term Monitor Instructions ? ?Your physician has requested you wear a ZIO patch monitor for 14 days.  ?This is a single patch monitor. [employer] supplies one patch monitor per enrollment. Additional ?stickers are not available. Please do not apply patch if you will be having a Nuclear Stress Test,  ?Echocardiogram, Cardiac CT, MRI, or Chest Xray during the period you would be wearing the  ?monitor. The patch cannot be worn during these tests. You cannot remove and re-apply the  ?ZIO XT patch monitor.  ?Your ZIO patch monitor will be mailed 3 day USPS to your address on file. It may take 3-5 days  ?to receive your monitor after you have been enrolled.  ?Once you have received your monitor, please review the enclosed instructions. Your monitor  ?has already been registered assigning a specific monitor serial # to you. ? ?Billing and Patient Assistance Program Information ? ?We have supplied [employer] with any of your insurance information on file for billing purposes. ?[employer] offers a sliding scale Patient Assistance Program for patients that do not have  ?insurance, or whose insurance does not completely cover the cost of the ZIO monitor.  ?You must apply for the Patient Assistance Program to qualify for this discounted rate.  ?To apply, please call [employer] at [phone], select  option 4, select option 2, ask to apply for  ?Patient Assistance Program. Meredeth Ide will ask your household income, and how many people  ?are in your household. They will quote your out-of-pocket cost based on that information.  ?[employer] will also be able to set up a 36-month, interest-free payment plan if needed. ? ?Applying the monitor ?  ?Shave hair from upper left chest.  ?Hold abrader disc by orange tab. Rub abrader in 40 strokes over the upper left chest as  ?indicated in your monitor instructions.  ?Clean area with 4 enclosed alcohol pads. Let dry.  ?Apply patch as indicated in monitor instructions. Patch will be placed under collarbone on left  ?side of chest with arrow pointing upward.  ?Rub patch adhesive wings for 2 minutes. Remove white label marked "1". Remove the white  ?label marked "2". Rub patch adhesive wings for 2 additional minutes.  ?While looking in a mirror, press and release button in center of patch. A small green light will  ?flash 3-4 times. This will be your only indicator that the monitor has been turned on.  ?Do not shower for the first 24 hours. You may shower after the first 24 hours.  ?Press the button if you feel a symptom. You will hear a small click. Record Date, Time and  ?Symptom in the Patient Logbook.  ?When you are ready to remove the patch, follow instructions on the last 2 pages of Patient  ?Logbook. Stick patch monitor onto the last page of Patient Logbook.  ?Place Patient Logbook  in the blue and white box. Use locking tab on box and tape box closed  ?securely. The blue and white box has prepaid postage on it. Please place it in the mailbox as  ?soon as possible. Your physician should have your test results approximately 7 days after the  ?monitor has been mailed back to [facility].  ?Call [facility] at [phone] if you have questions regarding  ?your ZIO XT patch monitor. Call them immediately if you see an orange light blinking on your  ?monitor.   ?If your monitor falls off in less than 4 days, contact our Monitor department at [phone].  ?If your monitor becomes loose or falls off after 4 days call [employer] at [phone] for  ?suggestions on securing your monitor ? ? ? ?Follow-Up: ?At [facility], you and your health needs are our priority.  As part of our continuing mission to provide you with exceptional heart care, we have created designated Provider Care Teams.  These Care Teams include your primary Cardiologist (physician) and Advanced Practice Providers (APPs -  Physician Assistants and Nurse Practitioners) who all work together to provide you with the care you need, when you need it. ? ?We recommend signing up for the patient portal called "MyChart".  Sign up information is provided on this After Visit Summary.  MyChart is used to connect with patients for Virtual Visits (Telemedicine).  Patients are able to view lab/test results, encounter notes, upcoming appointments, etc.  Non-urgent messages can be sent to your provider as well.   ?To learn more about what you can do with MyChart, go to [email or web address].   ? ?Your next appointment:   ?6 month(s) ? ?The format for your next appointment:   ?In Person ? ?Provider:   ?Thomasene Ripple, DO   ? ? ?Other Instructions ?  ?

## 2022-03-16 NOTE — Progress Notes (Unsigned)
Enrolled patient for a 14 day Zio XT  monitor to be mailed to patients home  °

## 2022-03-19 DIAGNOSIS — R002 Palpitations: Secondary | ICD-10-CM

## 2022-04-18 ENCOUNTER — Other Ambulatory Visit: Payer: Self-pay

## 2022-04-18 MED ORDER — FLECAINIDE ACETATE 50 MG PO TABS: 50.0000 mg | ORAL_TABLET | Freq: Two times a day (BID) | ORAL | 3 refills | Status: DC

## 2022-05-18 ENCOUNTER — Other Ambulatory Visit: Payer: Self-pay | Admitting: Family Medicine

## 2022-05-18 DIAGNOSIS — E2839 Other primary ovarian failure: Secondary | ICD-10-CM

## 2022-06-01 ENCOUNTER — Ambulatory Visit: Payer: Managed Care, Other (non HMO) | Admitting: Pulmonary Disease

## 2022-06-01 ENCOUNTER — Other Ambulatory Visit: Payer: Self-pay | Admitting: Pulmonary Disease

## 2022-06-01 ENCOUNTER — Encounter: Payer: Managed Care, Other (non HMO) | Admitting: Pulmonary Disease

## 2022-06-01 ENCOUNTER — Encounter: Payer: Self-pay | Admitting: Pulmonary Disease

## 2022-06-01 VITALS — BP 126/74 | HR 72 | Ht 62.0 in | Wt 211.0 lb

## 2022-06-01 DIAGNOSIS — R0609 Other forms of dyspnea: Secondary | ICD-10-CM

## 2022-06-01 DIAGNOSIS — R0602 Shortness of breath: Secondary | ICD-10-CM | POA: Diagnosis not present

## 2022-06-01 LAB — PULMONARY FUNCTION TEST
DL/VA % pred: 120 %
DL/VA: 5.13 ml/min/mmHg/L
DLCO cor % pred: 116 %
DLCO cor: 21.96 ml/min/mmHg
DLCO unc % pred: 116 %
DLCO unc: 21.96 ml/min/mmHg
FEF 25-75 Post: 4.66 L/sec
FEF 25-75 Pre: 3.86 L/sec
FEF2575-%Change-Post: 20 %
FEF2575-%Pred-Post: 212 %
FEF2575-%Pred-Pre: 175 %
FEV1-%Change-Post: 4 %
FEV1-%Pred-Post: 112 %
FEV1-%Pred-Pre: 108 %
FEV1-Post: 2.64 L
FEV1-Pre: 2.53 L
FEV1FVC-%Change-Post: 3 %
FEV1FVC-%Pred-Pre: 112 %
FEV6-%Change-Post: 0 %
FEV6-%Pred-Post: 98 %
FEV6-%Pred-Pre: 98 %
FEV6-Post: 2.88 L
FEV6-Pre: 2.87 L
FEV6FVC-%Change-Post: 0 %
FEV6FVC-%Pred-Post: 102 %
FEV6FVC-%Pred-Pre: 103 %
FVC-%Change-Post: 0 %
FVC-%Pred-Post: 96 %
FVC-%Pred-Pre: 95 %
FVC-Post: 2.92 L
FVC-Pre: 2.89 L
Post FEV1/FVC ratio: 91 %
Post FEV6/FVC ratio: 99 %
Pre FEV1/FVC ratio: 88 %
Pre FEV6/FVC Ratio: 99 %
RV % pred: 109 %
RV: 2.08 L
TLC % pred: 103 %
TLC: 4.9 L

## 2022-06-01 MED ORDER — FLUTICASONE-SALMETEROL 115-21 MCG/ACT IN AERO: 2.0000 | INHALATION_SPRAY | Freq: Two times a day (BID) | RESPIRATORY_TRACT | 12 refills | Status: DC

## 2022-06-01 NOTE — Patient Instructions (Addendum)
Try [employer], [employer] or [employer] for lunch options compared to other fast food restaurants.  Your pulmonary function tests are normal.   Try advair HFA inhaler 115-28mcg 1-2 puffs twice daily as needed - rinse mouth after each use  Follow up in 6 months

## 2022-06-01 NOTE — Progress Notes (Signed)
Synopsis: Referred in February 2022 by Dr. Alvester ChouMatthew Trifan for shortness of breath  Subjective:   PATIENT ID: Norma DeisPatricia E [name] GENDER: female DOB: 01/06/1961, MRN: [id number]  HPI  Chief Complaint  Patient presents with   Follow-up    F/U after PFT. States her breathing had been stable up until a few months ago. Increased SOB especially with exertion and bending over. Denies any increased coughing or wheezing.    Norma [name] is a 61 year old woman, never smoker with hypertension, obstructive sleep apnea on CPAP and diabetes mellitus type II who returns to pulmonary clinic for shortness of breath after Covid 19 pneumonia.   She has increased dyspnea with exertion and bending over since last visit. 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 She is not using flovent anymore. Denies cough or wheezing. She continues on CPAP.  PFTs today remain within normal limits.  She has gained 13lbs since we saw her in 02/16/21. She is drinking water mainly, no juices or soft drinks. She is eating fast food for lung throughout the week; ch[employer] file[employer], [place] [employer] and [employer]. She is cooking most dinners at home at night, meat and vegetables.   OV 06/07/21 She was started on flovent 110mcg 2 puffs, twice daily at last visit for dyspnea related to the covid 19 pneumonia and she was also started on fluticasone and ipratropium nasal spray for post nasal drainage, and is only using the ipratropium at this time. She reports her breathing is significantly improved and her sinus congestion is also improved and is now back on CPAP at night.   Initial OV 02/16/21  She was admitted 1/21 to 1/22 [facility] where she was provided 10 days of steroids and discharged with 2L supplemental oxygen. She had CTA chest at the time of admission which was negative for pulmonary emboli and showed diffuse bilateral ground glass infiltrates consistent with covid 19  pneumonia. She had repeat CTA chest on 01/31/21 after presenting to the ER with worsening shortness of breath, again no pulmonary emboli. She had a subsequent visit to the ER on 02/04/21 due to hypertension.   She has not been wearing her CPAP due to cough and sinus congestion. She does have post-nasal drainage. She does not find much relief from the albuterol inhaler.   She takes prilosec for GERD. She has been on lisinopril for many years.          Past Medical History:  Diagnosis Date   Arthritis    hips   Asthma    Costochondritis    Diabetes (HCC)    GERD (gastroesophageal reflux disease)    Headache    HTN (hypertension)    Hyperlipidemia    Obesity    Palpitations    Paresthesias    on Neurontin   Pinched nerve in neck    pt thinks it's between C3 and C4   PONV (postoperative nausea and vomiting)    PVC (premature ventricular contraction)    Sleep apnea    on CPAP; now moderate.      Family History  Problem Relation Age of Onset   Other Mother        benign brain tumor    Stroke Father    Heart attack Father    Multiple sclerosis Niece      Social History   Socioeconomic History   Marital status: Married    Spouse name: Not on file  Number of children: 4   Years of education: Not on file   Highest education level: Bachelor's degree (e.[name]., BA, AB, BS)  Occupational History   Not on file  Tobacco Use   Smoking status: Never   Smokeless tobacco: Never  Vaping Use   Vaping Use: Never used  Substance and Sexual Activity   Alcohol use: No   Drug use: No   Sexual activity: Not Currently    Birth control/protection: Post-menopausal  Other Topics Concern   Not on file  Social History Narrative   Lives at home with husband    Right handed   Caffeine: none    Social Determinants of Health   Financial Resource Strain: Not on file  Food Insecurity: Not on file  Transportation Needs: Not on file  Physical Activity: Not on file  Stress: Not on file   Social Connections: Not on file  Intimate Partner Violence: Not on file     Allergies  Allergen Reactions   Beconase Aq [Beclomethasone Diprop Monohyd] Shortness Of Breath   Atenolol Other (See Comments)    Depression and crying    Other     chloramycin eye drops - shortness of breath   Cephalexin Rash   Codeine Itching and Rash     Outpatient Medications Prior to Visit  Medication Sig Dispense Refill   albuterol (PROVENTIL HFA;VENTOLIN HFA) 108 (90 Base) MCG/ACT inhaler Inhale 2 puffs into the lungs every 6 (six) hours as needed for wheezing or shortness of breath.     amLODipine (NORVASC) 5 MG tablet Take 5 mg by mouth daily.     Ascorbic Acid (VITAMIN C) 1000 MG tablet Take 1,000 mg by mouth daily.     aspirin 81 MG tablet Take 81 mg by mouth daily.     Cholecalciferol (DIALYVITE VITAMIN D 5000) 125 MCG (5000 UT) capsule Take 5,000 Units by mouth daily.     cyclobenzaprine (FLEXERIL) 5 MG tablet Take 5 mg by mouth at bedtime as needed for muscle spasms.     Digestive Enzyme CAPS Take 1 capsule by mouth 3 (three) times daily before meals.     escitalopram (LEXAPRO) 5 MG tablet Take 10 mg by mouth daily.      flecainide (TAMBOCOR) 50 MG tablet Take 1 tablet (50 mg total) by mouth 2 (two) times daily. 180 tablet 3   gabapentin (NEURONTIN) 600 MG tablet Take 600 mg by mouth at bedtime.     Lancets (ONETOUCH DELICA PLUS LANCET33G) MISC Apply 1 each topically daily.     metFORMIN (GLUCOPHAGE-XR) 500 MG 24 hr tablet Take 1,000 mg by mouth daily with breakfast.     metoprolol succinate (TOPROL-XL) 50 MG 24 hr tablet Take 100 mg by mouth every evening.     ONETOUCH VERIO test strip 1 each daily.     OVER THE COUNTER MEDICATION Take 1 capsule by mouth with breakfast, with lunch, and with evening meal. lipo-gen     OVER THE COUNTER MEDICATION Take 1 Scoop by mouth 2 (two) times daily with a meal. Lauricidin     OVER THE COUNTER MEDICATION Take 4 capsules by mouth daily. thyrotain     OVER  THE COUNTER MEDICATION Take 2 capsules by mouth 2 (two) times daily. silymarin forte     OVER THE COUNTER MEDICATION Take 1 capsule by mouth daily. omegagenics mega 10     OVER THE COUNTER MEDICATION Take 2 capsules by mouth daily with lunch. blood sugar support  OVER THE COUNTER MEDICATION Take 1 Scoop by mouth daily. glutashield     valsartan (DIOVAN) 320 MG tablet Take 320 mg by mouth daily.     nitroGLYCERIN (NITROSTAT) 0.4 MG SL tablet Place 1 tablet (0.4 mg total) under the tongue every 5 (five) minutes as needed for chest pain. Up to 3 times. 90 tablet 3   lidocaine (LIDODERM) 5 % Place 1 patch onto the skin daily. Remove & Discard patch within 12 hours or as directed by MD 5 patch 0   No facility-administered medications prior to visit.   Review of Systems  Constitutional:  Negative for chills, fever, malaise/fatigue and weight loss.  HENT:  Negative for congestion, sinus pain and sore throat.   Eyes: Negative.   Respiratory:  Positive for shortness of breath. Negative for cough, hemoptysis, sputum production and wheezing.   Cardiovascular:  Negative for chest pain, palpitations, orthopnea, claudication and leg swelling.  Gastrointestinal:  Negative for abdominal pain, heartburn, nausea and vomiting.  Genitourinary: Negative.   Musculoskeletal:  Negative for joint pain and myalgias.  Skin:  Negative for rash.  Neurological:  Negative for weakness and headaches.  Endo/Heme/Allergies: Negative.   Psychiatric/Behavioral:  The patient is not nervous/anxious.    Objective:   Vitals:   06/01/22 1056  BP: 126/74  Pulse: 72  SpO2: 96%  Weight: 211 lb (95.7 kg)  Height:  (1.575 m)   Physical Exam Constitutional:      General: She is not in acute distress.    Appearance: She is obese. She is not ill-appearing.  HENT:     Head: Normocephalic and atraumatic.  Eyes:     General: No scleral icterus.    Conjunctiva/sclera: Conjunctivae normal.  Cardiovascular:     Rate  and Rhythm: Normal rate and regular rhythm.     Pulses: Normal pulses.     Heart sounds: Normal heart sounds. No murmur heard. Pulmonary:     Effort: Pulmonary effort is normal.     Breath sounds: Normal breath sounds. No wheezing, rhonchi or rales.  Musculoskeletal:     Right lower leg: No edema.     Left lower leg: No edema.  Skin:    General: Skin is warm and dry.  Neurological:     General: No focal deficit present.     Mental Status: She is alert.  Psychiatric:        Mood and Affect: Mood normal.        Behavior: Behavior normal.        Thought Content: Thought content normal.        Judgment: Judgment normal.    CBC    Component Value Date/Time   WBC 9.6 02/23/2022 1443   WBC 9.6 02/20/2022 1002   RBC 5.15 02/23/2022 1443   RBC 5.23 (H) 02/20/2022 1002   HGB 12.6 03/02/2022 1108   HGB 15.1 02/23/2022 1443   HCT 37.0 03/02/2022 1108   HCT 42.6 02/23/2022 1443   PLT 248 02/23/2022 1443   MCV 83 02/23/2022 1443   MCH 29.3 02/23/2022 1443   MCH 28.1 02/20/2022 1002   MCHC 35.4 02/23/2022 1443   MCHC 33.6 02/20/2022 1002   RDW 14.3 02/23/2022 1443   LYMPHSABS 2.1 02/23/2022 1443   MONOABS 0.7 01/31/2021 1245   EOSABS 0.2 02/23/2022 1443   BASOSABS 0.1 02/23/2022 1443      Latest Ref Rng & Units 03/02/2022   11:08 AM 03/02/2022   11:04 AM 03/02/2022   11:02 AM  BMP  Sodium 135 - 145 mmol/L 143   142   142    Potassium 3.5 - 5.1 mmol/L 3.5   3.6   3.7      Chest imaging: Chest CT 01/31/2021 1. No evidence of pulmonary embolus. 2. Residual ground-glass opacities with peripheral predominance in bilateral lungs. Overall aeration has improved when compared to January 15, 2021. Findings are most consistent with resolving COVID-19 pneumonia.   Chest CT 01/15/21 No central, segmental, or subsegmental pulmonary embolism   Multifocal patchy airspace opacities throughout both lungs, predominantly within the lower lungs, consistent with COVID Pneumonia.   PFT:     Latest Ref Rng & Units 06/01/2022    9:50 AM 06/07/2021    9:39 AM  PFT Results  FVC-Pre L 2.89   2.68    FVC-Predicted Pre % 95   87    FVC-Post L 2.92   2.68    FVC-Predicted Post % 96   87    Pre FEV1/FVC % % 88   93    Post FEV1/FCV % % 91   95    FEV1-Pre L 2.53   2.49    FEV1-Predicted Pre % 108   105    FEV1-Post L 2.64   2.55    DLCO uncorrected ml/min/mmHg 21.96   24.17    DLCO UNC% % 116   128    DLCO corrected ml/min/mmHg 21.96   24.17    DLCO COR %Predicted % 116   128    DLVA Predicted % 120   145    TLC L 4.90   5.14    TLC % Predicted % 103   108    RV % Predicted % 109   130    PFTs: Normal pulmonary function     Assessment & Plan:   Shortness of breath - Plan: fluticasone-salmeterol (ADVAIR HFA) 115-21 MCG/ACT inhaler  Discussion: Jovon Streetman is a 61 year old woman, never smoker with hypertension, obstructive sleep apnea on CPAP and diabetes mellitus type II who returns to pulmonary clinic for shortness of breath after Covid 19 pneumonia.   She has return of exertional dyspnea and when bending over. I discussed with her that this is likely weight related and when bending over the diaphragm is limited in its ability due to compression from her weigh.   PFTs are again normal today. We will trial her on advair 115-25mcg for possible reactive airways disease.   I have recommended healthier take out options given she is eating primarily fast foods for lunch.  Follow up in 6 months.   Melody Comas, MD [facility] Office: [phone]    Current Outpatient Medications:    albuterol (PROVENTIL HFA;VENTOLIN HFA) 108 (90 Base) MCG/ACT inhaler, Inhale 2 puffs into the lungs every 6 (six) hours as needed for wheezing or shortness of breath., Disp: , Rfl:    amLODipine (NORVASC) 5 MG tablet, Take 5 mg by mouth daily., Disp: , Rfl:    Ascorbic Acid (VITAMIN C) 1000 MG tablet, Take 1,000 mg by mouth daily., Disp: , Rfl:    aspirin 81 MG  tablet, Take 81 mg by mouth daily., Disp: , Rfl:    Cholecalciferol (DIALYVITE VITAMIN D 5000) 125 MCG (5000 UT) capsule, Take 5,000 Units by mouth daily., Disp: , Rfl:    cyclobenzaprine (FLEXERIL) 5 MG tablet, Take 5 mg by mouth at bedtime as needed for muscle spasms., Disp: , Rfl:    Digestive Enzyme CAPS, Take  1 capsule by mouth 3 (three) times daily before meals., Disp: , Rfl:    escitalopram (LEXAPRO) 5 MG tablet, Take 10 mg by mouth daily. , Disp: , Rfl:    flecainide (TAMBOCOR) 50 MG tablet, Take 1 tablet (50 mg total) by mouth 2 (two) times daily., Disp: 180 tablet, Rfl: 3   fluticasone-salmeterol (ADVAIR HFA) 115-21 MCG/ACT inhaler, Inhale 2 puffs into the lungs 2 (two) times daily., Disp: 1 each, Rfl: 12   gabapentin (NEURONTIN) 600 MG tablet, Take 600 mg by mouth at bedtime., Disp: , Rfl:    Lancets (ONETOUCH DELICA PLUS LANCET33G) MISC, Apply 1 each topically daily., Disp: , Rfl:    metFORMIN (GLUCOPHAGE-XR) 500 MG 24 hr tablet, Take 1,000 mg by mouth daily with breakfast., Disp: , Rfl:    metoprolol succinate (TOPROL-XL) 50 MG 24 hr tablet, Take 100 mg by mouth every evening., Disp: , Rfl:    ONETOUCH VERIO test strip, 1 each daily., Disp: , Rfl:    OVER THE COUNTER MEDICATION, Take 1 capsule by mouth with breakfast, with lunch, and with evening meal. lipo-gen, Disp: , Rfl:    OVER THE COUNTER MEDICATION, Take 1 Scoop by mouth 2 (two) times daily with a meal. Lauricidin, Disp: , Rfl:    OVER THE COUNTER MEDICATION, Take 4 capsules by mouth daily. thyrotain, Disp: , Rfl:    OVER THE COUNTER MEDICATION, Take 2 capsules by mouth 2 (two) times daily. silymarin forte, Disp: , Rfl:    OVER THE COUNTER MEDICATION, Take 1 capsule by mouth daily. omegagenics mega 10, Disp: , Rfl:    OVER THE COUNTER MEDICATION, Take 2 capsules by mouth daily with lunch. blood sugar support, Disp: , Rfl:    OVER THE COUNTER MEDICATION, Take 1 Scoop by mouth daily. glutashield, Disp: , Rfl:    valsartan  (DIOVAN) 320 MG tablet, Take 320 mg by mouth daily., Disp: , Rfl:    nitroGLYCERIN (NITROSTAT) 0.4 MG SL tablet, Place 1 tablet (0.4 mg total) under the tongue every 5 (five) minutes as needed for chest pain. Up to 3 times., Disp: 90 tablet, Rfl: 3

## 2022-06-06 ENCOUNTER — Other Ambulatory Visit: Payer: Self-pay | Admitting: Family Medicine

## 2022-06-06 DIAGNOSIS — Z1231 Encounter for screening mammogram for malignant neoplasm of breast: Secondary | ICD-10-CM

## 2022-06-30 ENCOUNTER — Other Ambulatory Visit: Payer: Managed Care, Other (non HMO)

## 2022-07-03 IMAGING — CT CT ANGIO CHEST
2 of 6 series · 18 of 36 positions shown · IV contrast (OMNIPAQUE 350)
Comparison: Radiograph same day

CLINICAL DATA: Shortness of breath COVID positive

EXAM:
CT ANGIOGRAPHY CHEST WITH CONTRAST
TECHNIQUE: Multidetector CT imaging of the chest was performed using the
standard protocol during bolus administration of intravenous
contrast. Multiplanar CT image reconstructions and MIPs were
obtained to evaluate the vascular anatomy.
CONTRAST:  100mL OMNIPAQUE IOHEXOL 350 MG/ML SOLN

[Series 5: thins · axial · 0.64mm/px · z∈[+1440,+1653]mm · 17 of 241 slices shown]
[im 14/241  lung]
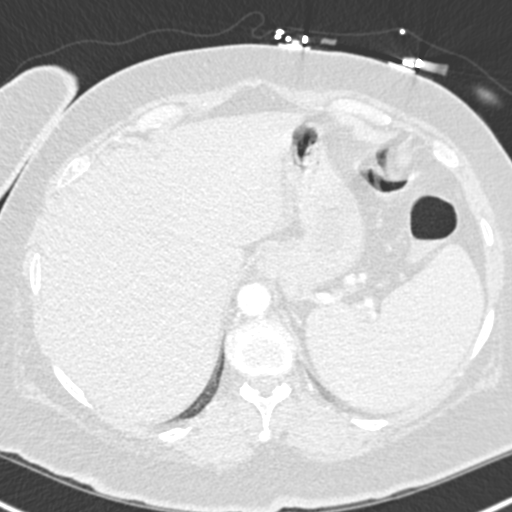
[im 27/241  mediastinal]
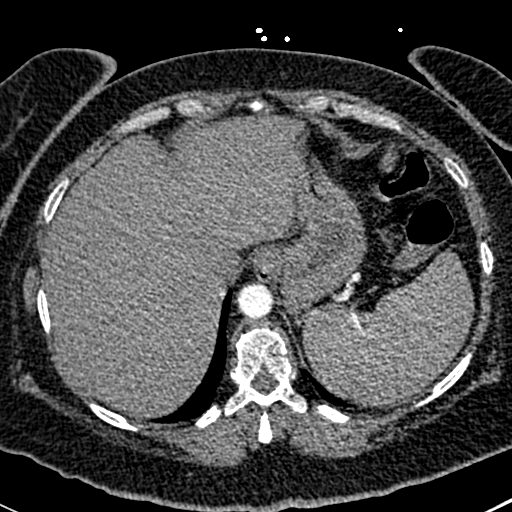
[im 41/241  lung]
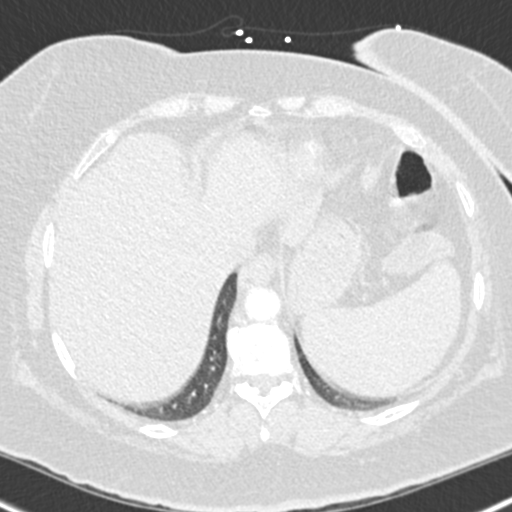
[im 54/241  mediastinal]
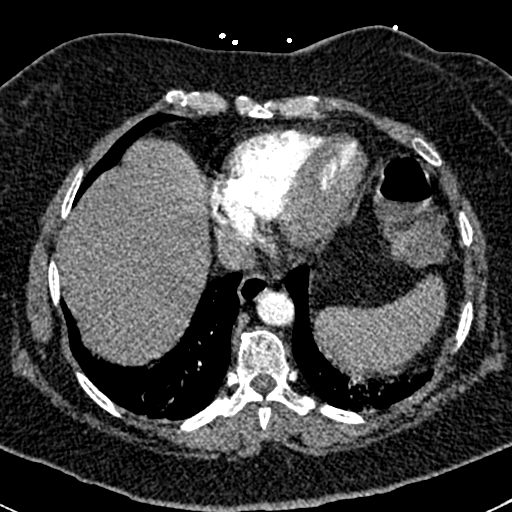
[im 67/241  lung]
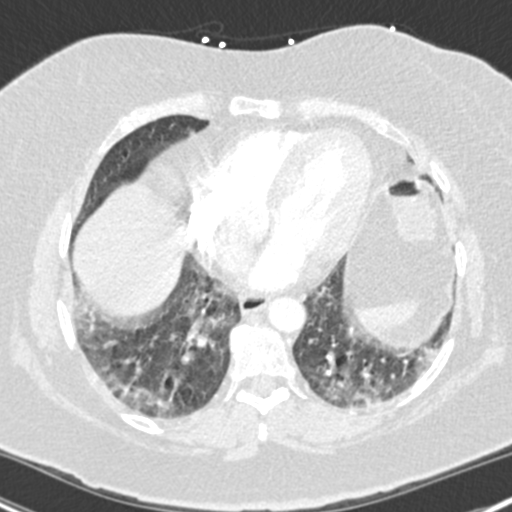
[im 81/241  mediastinal]
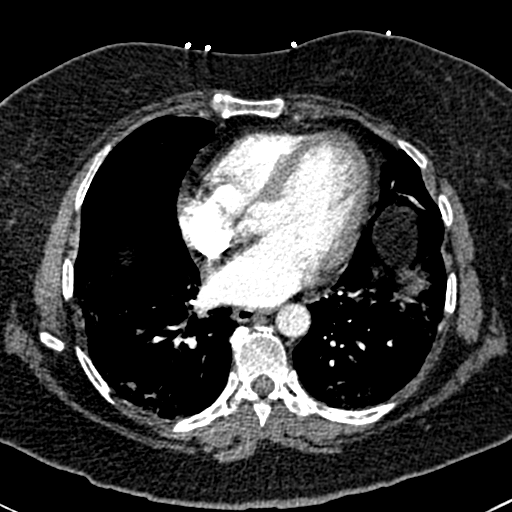
[im 94/241  lung]
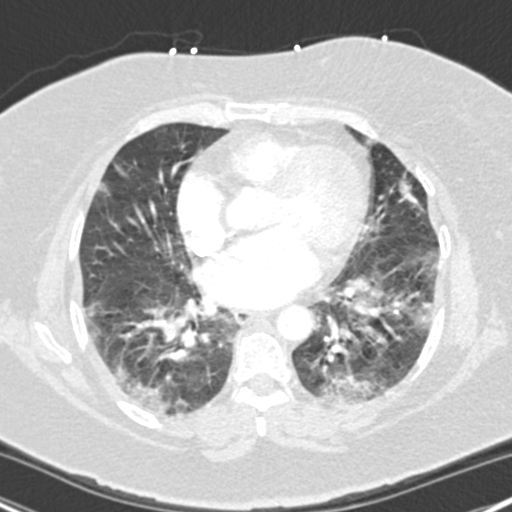
[im 107/241  mediastinal]
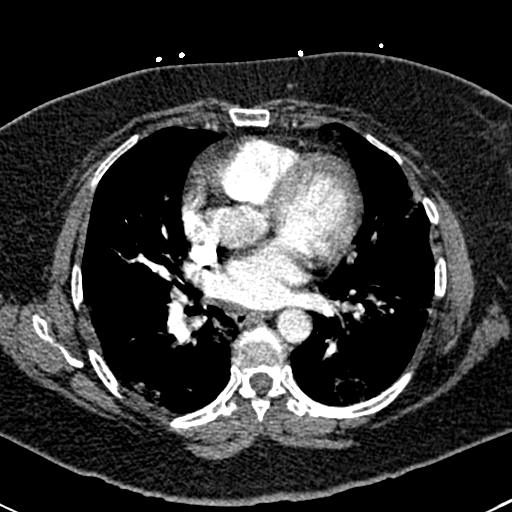
[im 121/241  lung]
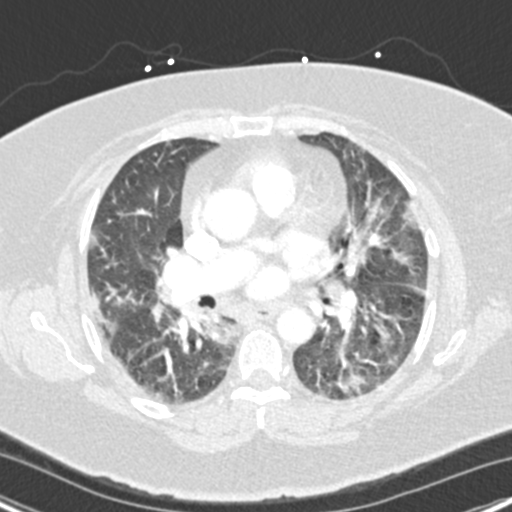
[im 134/241  mediastinal]
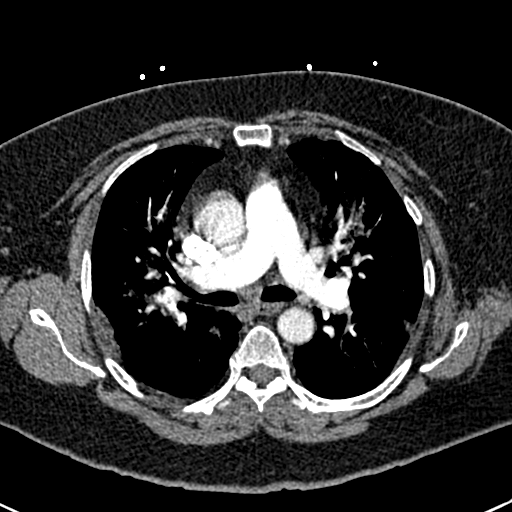
[im 147/241  lung]
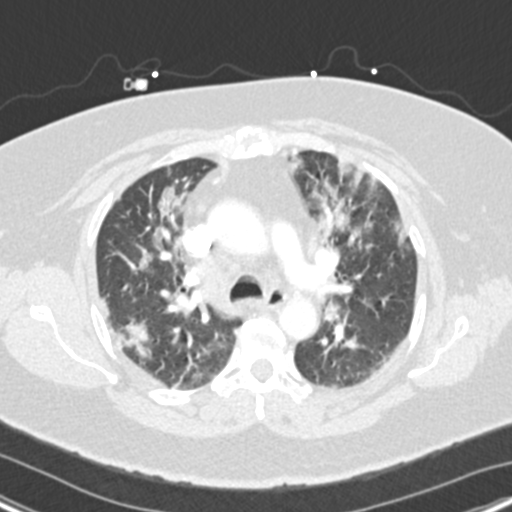
[im 161/241  mediastinal]
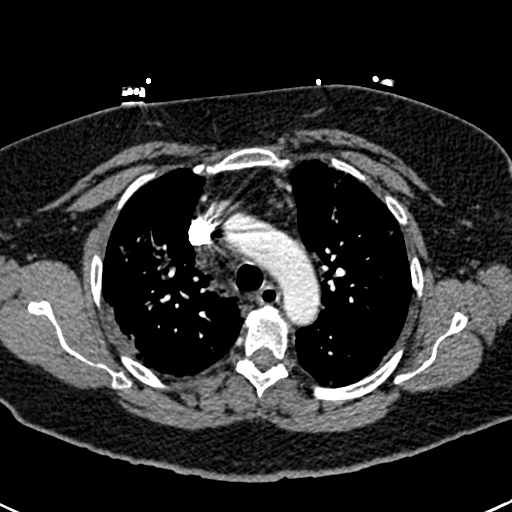
[im 174/241  lung]
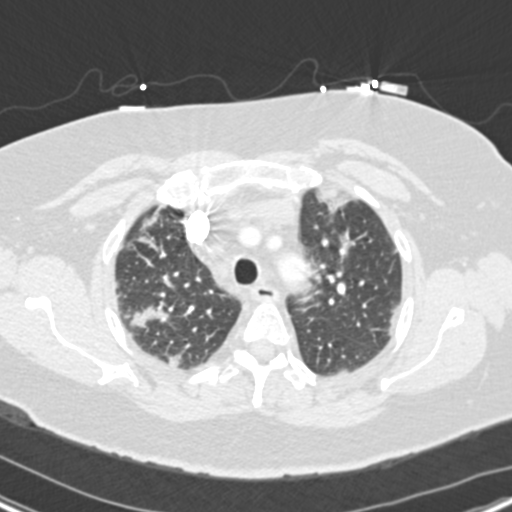
[im 187/241  mediastinal]
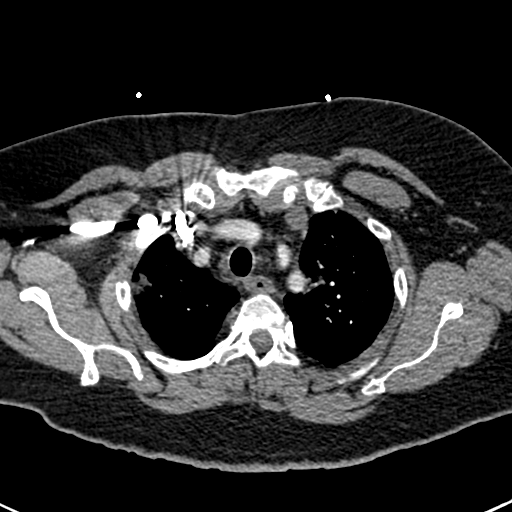
[im 201/241  lung]
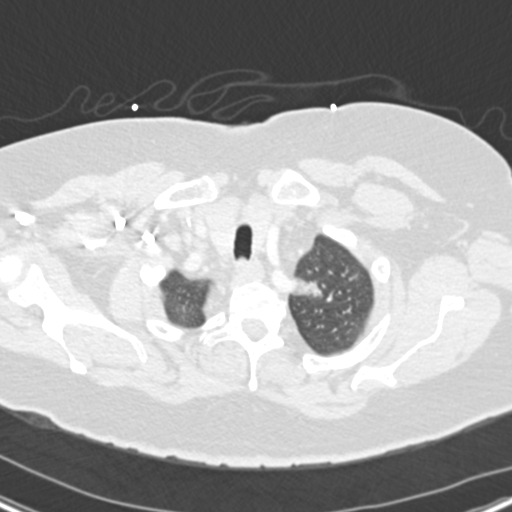
[im 214/241  mediastinal]
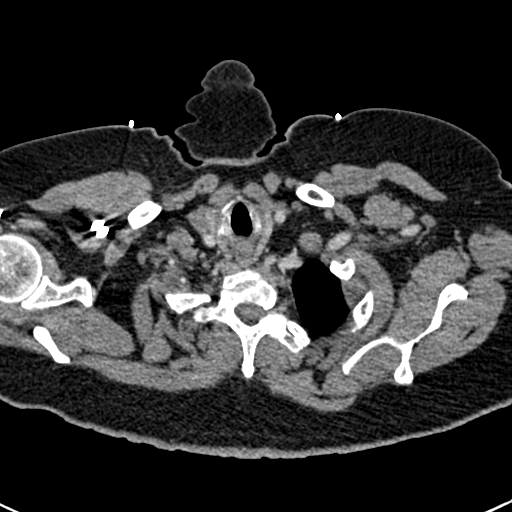
[im 227/241  lung]
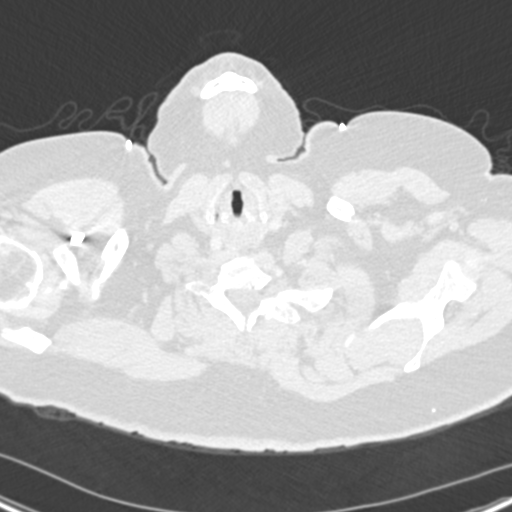

[Series 7: coronal mpr · coronal · 0.49mm/px · 1 of 136 slices shown]
[im 68/136  mediastinal]
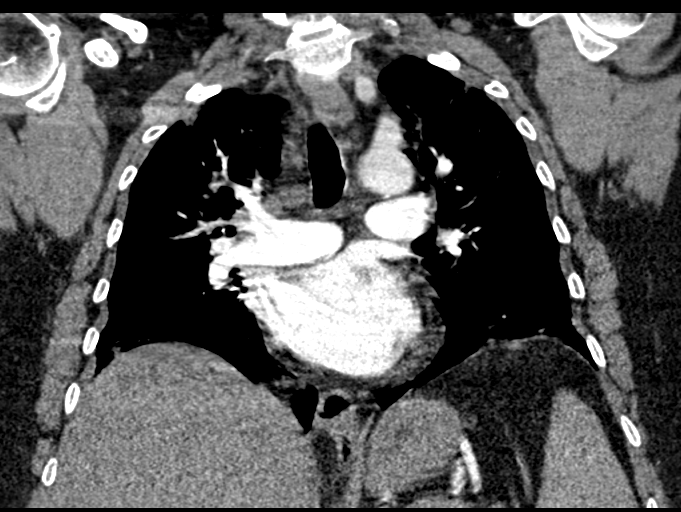

[18 of 36 positions shown; findings below may reference images not displayed]

FINDINGS: Cardiovascular: There is a optimal opacification of the pulmonary
arteries. There is no central,segmental, or subsegmental filling
defects within the pulmonary arteries. The heart is normal in size.
No pericardial effusion or thickening. No evidence right heart
strain. There is normal three-vessel brachiocephalic anatomy without
proximal stenosis. The thoracic aorta is normal in appearance.

Mediastinum/Nodes: Scattered prevascular, pretracheal, and
subcarinal lymph nodes are seen. Within the right pretracheal space
there is a 9 mm lymph node seen. Thyroid gland, trachea, and
esophagus demonstrate no significant findings.

Lungs/Pleura: Extensive multifocal patchy airspace opacities are
seen throughout both lungs, predominantly within both lower lungs.
No pleural effusion or pneumothorax.

Upper Abdomen: No acute abnormalities present in the visualized
portions of the upper abdomen.

Musculoskeletal: No chest wall abnormality. No acute or significant
osseous findings.

Review of the MIP images confirms the above findings.
IMPRESSION: No central, segmental, or subsegmental pulmonary embolism

Multifocal patchy airspace opacities throughout both lungs,
predominantly within the lower lungs, consistent with COVID
pneumonia

## 2022-07-17 ENCOUNTER — Encounter: Payer: Self-pay | Admitting: Cardiology

## 2022-07-26 ENCOUNTER — Ambulatory Visit
Admission: RE | Admit: 2022-07-26 | Discharge: 2022-07-26 | Disposition: A | Discharge status: Home / Self Care | Payer: Managed Care, Other (non HMO) | Source: Ambulatory Visit | Attending: Family Medicine | Admitting: Family Medicine

## 2022-07-26 DIAGNOSIS — E2839 Other primary ovarian failure: Secondary | ICD-10-CM

## 2022-08-01 ENCOUNTER — Ambulatory Visit: Payer: Managed Care, Other (non HMO)

## 2022-08-08 ENCOUNTER — Ambulatory Visit
Admission: RE | Admit: 2022-08-08 | Discharge: 2022-08-08 | Disposition: A | Discharge status: Home / Self Care | Payer: Managed Care, Other (non HMO) | Source: Ambulatory Visit | Attending: Family Medicine | Admitting: Family Medicine

## 2022-08-08 DIAGNOSIS — Z1231 Encounter for screening mammogram for malignant neoplasm of breast: Secondary | ICD-10-CM

## 2022-09-26 ENCOUNTER — Ambulatory Visit: Payer: Managed Care, Other (non HMO) | Admitting: Cardiology

## 2022-10-21 ENCOUNTER — Encounter: Payer: Self-pay | Admitting: Cardiology

## 2022-10-21 ENCOUNTER — Ambulatory Visit: Payer: Commercial Managed Care - HMO | Attending: Cardiology | Admitting: Cardiology

## 2022-10-21 VITALS — BP 162/96 | HR 66 | Ht 62.0 in | Wt 210.0 lb

## 2022-10-21 DIAGNOSIS — E782 Mixed hyperlipidemia: Secondary | ICD-10-CM

## 2022-10-21 DIAGNOSIS — Z79899 Other long term (current) drug therapy: Secondary | ICD-10-CM

## 2022-10-21 DIAGNOSIS — I493 Ventricular premature depolarization: Secondary | ICD-10-CM | POA: Diagnosis not present

## 2022-10-21 DIAGNOSIS — I471 Supraventricular tachycardia, unspecified: Secondary | ICD-10-CM

## 2022-10-21 NOTE — Patient Instructions (Signed)
Medication Instructions:  Your physician recommends that you continue on your current medications as directed. Please refer to the Current Medication list given to you today.  *If you need a refill on your cardiac medications before your next appointment, please call your pharmacy*   Lab Work: Your physician recommends that you have the following labs drawn today: Flecainide level  If you have labs (blood work) drawn today and your tests are completely normal, you will receive your results only by: [place] (if you have MyChart) OR A paper copy in the mail If you have any lab test that is abnormal or we need to change your treatment, we will call you to review the results.   Testing/Procedures: NONE   Follow-Up: At [facility], you and your health needs are our priority.  As part of our continuing mission to provide you with exceptional heart care, we have created designated Provider Care Teams.  These Care Teams include your primary Cardiologist (physician) and Advanced Practice Providers (APPs -  Physician Assistants and Nurse Practitioners) who all work together to provide you with the care you need, when you need it.  We recommend signing up for the patient portal called "MyChart".  Sign up information is provided on this After Visit Summary.  MyChart is used to connect with patients for Virtual Visits (Telemedicine).  Patients are able to view lab/test results, encounter notes, upcoming appointments, etc.  Non-urgent messages can be sent to your provider as well.   To learn more about what you can do with MyChart, go to [email or web address].    Your next appointment:   1 year(s)  The format for your next appointment:   In Person  Provider:   Berniece Salines, DO

## 2022-10-21 NOTE — Progress Notes (Signed)
Cardiology Office Note:    Date:  10/21/2022   ID:  Norma [name], DOB July 19, 1961, MRN [id number]  PCP:  Johny Blamer, MD  Cardiologist:  Thomasene Ripple, DO  Electrophysiologist:  None   Referring MD: Johny Blamer, MD   " I am still short of breath"  History of Present Illness:    Norma [name] is a 61 y.o. female with a hx of type 2 diabetes, morbid obesity, hyperlipidemia, hypertension, history of COVID-19 infection now 2 episodes of COVID first in 2022 which led to chronic respiratory failure where she needed oxygen and was eventually titrated off oxygen, long COVID syndrome, OSA on CPAP.  I first saw the patient on February 23, 2022 at that time she was presented chest discomfort.  She was concerned about her chest discomfort as well as her shortness of breath her symptoms sound is typical and given the fact that she had significant risk factors we proceeded with a right and left heart catheterization.  She did get her catheterization which was completely normal.  At her last visit I placed a monitor on the patient to understand her palpitations.  To monitor that showed paroxysmal SVT.  Started the patient on flecainide along with her beta-blocker.  Today she tells me that she is very happy about starting the flecainide she is doing well with this.  She is not having any symptoms.  Past Medical History:  Diagnosis Date   Arthritis    hips   Asthma    Costochondritis    Diabetes (HCC)    GERD (gastroesophageal reflux disease)    Headache    HTN (hypertension)    Hyperlipidemia    Obesity    Palpitations    Paresthesias    on Neurontin   Pinched nerve in neck    pt thinks it's between C3 and C4   PONV (postoperative nausea and vomiting)    PVC (premature ventricular contraction)    Sleep apnea    on CPAP; now moderate.     Past Surgical History:  Procedure Laterality Date   BREAST MASS EXCISION Left    CESAREAN SECTION     CHOLECYSTECTOMY N/A 09/23/2016    Procedure: LAPAROSCOPIC CHOLECYSTECTOMY WITH INTRAOPERATIVE CHOLANGIOGRAM;  Surgeon: Luretha Murphy, MD;  Location: [facility];  Service: General;  Laterality: N/A;   EYE SURGERY     to correct cross sidedness    novasure procedure      RADIOACTIVE SEED GUIDED EXCISIONAL BREAST BIOPSY Left 07/20/2018   Procedure: RADIOACTIVE SEED GUIDED EXCISIONAL BREAST BIOPSY;  Surgeon: Luretha Murphy, MD;  Location: [facility];  Service: General;  Laterality: Left;   RIGHT/LEFT HEART CATH AND CORONARY ANGIOGRAPHY N/A 03/02/2022   Procedure: RIGHT/LEFT HEART CATH AND CORONARY ANGIOGRAPHY;  Surgeon: Orbie Pyo, MD;  Location: [facility];  Service: Cardiovascular;  Laterality: N/A;   TUBAL LIGATION      Current Medications: Current Meds  Medication Sig   albuterol (PROVENTIL HFA;VENTOLIN HFA) 108 (90 Base) MCG/ACT inhaler Inhale 2 puffs into the lungs every 6 (six) hours as needed for wheezing or shortness of breath.   amLODipine (NORVASC) 5 MG tablet Take 5 mg by mouth daily.   Ascorbic Acid (VITAMIN C) 1000 MG tablet Take 1,000 mg by mouth daily.   aspirin 81 MG tablet Take 81 mg by mouth daily.   Cholecalciferol (DIALYVITE VITAMIN D 5000) 125 MCG (5000 UT) capsule Take 5,000 Units by mouth daily.   cyclobenzaprine (FLEXERIL) 5 MG  tablet Take 5 mg by mouth at bedtime as needed for muscle spasms.   Digestive Enzyme CAPS Take 1 capsule by mouth 3 (three) times daily before meals.   escitalopram (LEXAPRO) 5 MG tablet Take 10 mg by mouth daily.    flecainide (TAMBOCOR) 50 MG tablet Take 1 tablet (50 mg total) by mouth 2 (two) times daily.   fluticasone-salmeterol (ADVAIR HFA) 115-21 MCG/ACT inhaler Inhale 2 puffs into the lungs 2 (two) times daily.   gabapentin (NEURONTIN) 600 MG tablet Take 600 mg by mouth at bedtime.   Lancets (ONETOUCH DELICA PLUS LANCET33G) MISC Apply 1 each topically daily.   metFORMIN (GLUCOPHAGE-XR) 500 MG 24 hr tablet Take 1,000 mg by mouth daily with breakfast.    metoprolol succinate (TOPROL-XL) 50 MG 24 hr tablet Take 100 mg by mouth every evening.   nitroGLYCERIN (NITROSTAT) 0.4 MG SL tablet Place 1 tablet (0.4 mg total) under the tongue every 5 (five) minutes as needed for chest pain. Up to 3 times.   ONETOUCH VERIO test strip 1 each daily.   OVER THE COUNTER MEDICATION Take 1 capsule by mouth with breakfast, with lunch, and with evening meal. lipo-gen   OVER THE COUNTER MEDICATION Take 1 Scoop by mouth 2 (two) times daily with a meal. Lauricidin   OVER THE COUNTER MEDICATION Take 4 capsules by mouth daily. thyrotain   OVER THE COUNTER MEDICATION Take 2 capsules by mouth 2 (two) times daily. silymarin forte   OVER THE COUNTER MEDICATION Take 1 capsule by mouth daily. omegagenics mega 10   OVER THE COUNTER MEDICATION Take 2 capsules by mouth daily with lunch. blood sugar support   OVER THE COUNTER MEDICATION Take 1 Scoop by mouth daily. glutashield   valsartan (DIOVAN) 320 MG tablet Take 320 mg by mouth daily.     Allergies:   Beconase aq [beclomethasone diprop monohyd], Atenolol, Other, Cephalexin, and Codeine   Social History   Socioeconomic History   Marital status: Married    Spouse name: Not on file   Number of children: 4   Years of education: Not on file   Highest education level: Bachelor's degree (e.g., BA, AB, BS)  Occupational History   Not on file  Tobacco Use   Smoking status: Never   Smokeless tobacco: Never  Vaping Use   Vaping Use: Never used  Substance and Sexual Activity   Alcohol use: No   Drug use: No   Sexual activity: Not Currently    Birth control/protection: Post-menopausal  Other Topics Concern   Not on file  Social History Narrative   Lives at home with husband    Right handed   Caffeine: none    Social Determinants of Health   Financial Resource Strain: Not on file  Food Insecurity: Not on file  Transportation Needs: Not on file  Physical Activity: Not on file  Stress: Not on file  Social  Connections: Not on file     Family History: The patient's family history includes Heart attack in her father; Multiple sclerosis in her niece; Other in her mother; Stroke in her father.  ROS:   Review of Systems  Constitution: Negative for decreased appetite, fever and weight gain.  HENT: Negative for congestion, ear discharge, hoarse voice and sore throat.   Eyes: Negative for discharge, redness, vision loss in right eye and visual halos.  Cardiovascular: Reports chest pain, dyspnea on exertion, palpitation.  Negative for leg swelling, orthopnea. Respiratory: Negative for cough, hemoptysis, shortness of breath and snoring.  Endocrine: Negative for heat intolerance and polyphagia.  Hematologic/Lymphatic: Negative for bleeding problem. Does not bruise/bleed easily.  Skin: Negative for flushing, nail changes, rash and suspicious lesions.  Musculoskeletal: Negative for arthritis, joint pain, muscle cramps, myalgias, neck pain and stiffness.  Gastrointestinal: Negative for abdominal pain, bowel incontinence, diarrhea and excessive appetite.  Genitourinary: Negative for decreased libido, genital sores and incomplete emptying.  Neurological: Negative for brief paralysis, focal weakness, headaches and loss of balance.  Psychiatric/Behavioral: Negative for altered mental status, depression and suicidal ideas.  Allergic/Immunologic: Negative for HIV exposure and persistent infections.    EKGs/Labs/Other Studies Reviewed:    The following studies were reviewed today:   EKG:  None today   April 11, 2022 Patch Wear Time:  14 days and 0 hours (2023-03-25T21:55:54-0400 to 2023-04-08T21:55:58-0400)   Patient had a min HR of 51 bpm, max HR of 174 bpm, and avg HR of 71 bpm. Predominant underlying rhythm was Sinus Rhythm.    65 Supraventricular Tachycardia runs occurred, the run with the fastest interval lasting 16 beats with a max rate of 174 bpm (avg 138  bpm); the run with the fastest  interval was also the longest. Supraventricular Tachycardia was detected within +/- 45 seconds of symptomatic patient event(s). Isolated SVEs were rare (<1.0%), SVE Couplets were rare (<1.0%), and SVE Triplets were rare (<1.0%). Isolated VEs were rare (<1.0%, 1389), VE Couplets were rare (<1.0%, 8), and VE Triplets were rare (<1.0%, 1). Ventricular Bigeminy was present.    Symptoms associated with supraventricular tachycardia as well as rare premature ventricular complex.     Conclusion: This study is remarkable for paroxysmal supraventricular tachycardia.   TTE 03/07/2022 IMPRESSIONS   1. Left ventricular ejection fraction, by estimation, is 60 to 65%. The  left ventricle has normal function. The left ventricle has no regional  wall motion abnormalities. Left ventricular diastolic parameters are  consistent with Grade I diastolic  dysfunction (impaired relaxation). The average left ventricular global  longitudinal strain is -24.9 %. The global longitudinal strain is normal.   2. Right ventricular systolic function is normal. The right ventricular  size is normal. Tricuspid regurgitation signal is inadequate for assessing  PA pressure.   3. The mitral valve is grossly normal. Trivial mitral valve  regurgitation.   4. The aortic valve is tricuspid. Aortic valve regurgitation is not  visualized.   5. The inferior vena cava is normal in size with greater than 50%  respiratory variability, suggesting right atrial pressure of 3 mmHg.   6. Cannot exclude a small PFO.   Comparison(s): No prior Echocardiogram.   FINDINGS   Left Ventricle: Left ventricular ejection fraction, by estimation, is 60  to 65%. The left ventricle has normal function. The left ventricle has no  regional wall motion abnormalities. The average left ventricular global  longitudinal strain is -24.9 %.  The global longitudinal strain is normal. The left ventricular internal  cavity size was normal in size. There is no left  ventricular hypertrophy.  Left ventricular diastolic parameters are consistent with Grade I  diastolic dysfunction (impaired  relaxation). Indeterminate filling pressures.   Right Ventricle: The right ventricular size is normal. No increase in  right ventricular wall thickness. Right ventricular systolic function is  normal. Tricuspid regurgitation signal is inadequate for assessing PA  pressure.   Left Atrium: Left atrial size was normal in size.   Right Atrium: Right atrial size was normal in size.   Pericardium: There is no evidence of pericardial effusion.  Mitral Valve: The mitral valve is grossly normal. Trivial mitral valve  regurgitation.   Tricuspid Valve: The tricuspid valve is grossly normal. Tricuspid valve  regurgitation is trivial.   Aortic Valve: The aortic valve is tricuspid. Aortic valve regurgitation is  not visualized. Aortic valve mean gradient measures 7.0 mmHg. Aortic valve  peak gradient measures 13.3 mmHg. Aortic valve area, by VTI measures 3.01  cm.   Pulmonic Valve: The pulmonic valve was normal in structure. Pulmonic valve  regurgitation is trivial.   Aorta: The aortic root and ascending aorta are structurally normal, with  no evidence of dilitation.   Venous: The inferior vena cava is normal in size with greater than 50%  respiratory variability, suggesting right atrial pressure of 3 mmHg.   IAS/Shunts: Cannot exclude a small PFO.   Cardiac catheterization March 02, 2022   LV end diastolic pressure is normal.   LV end diastolic pressure is normal.   The left ventricular ejection fraction is 55-65% by visual estimate.   1.  Normal right dominant circulation with no obstructive disease. 2.  Normal LVEDP of 16 mmHg with preserved ejection fraction. 3.  Normal cardiac output of 5.96 L/min with an index of 3.0 L/min/m with mean RA pressure of 5 mmHg, mean wedge pressure of 8 mmHg, and PA saturation of 72%.   Recommendations: Consider evaluation  for noncardiac etiologies of chest pain and dyspnea.  Recent Labs: 02/23/2022: BUN 11; Creatinine, Ser 0.48; Magnesium 2.2; Platelets 248 03/02/2022: Hemoglobin 12.6; Potassium 3.5; Sodium 143  Recent Lipid Panel    Component Value Date/Time   TRIG 151 (H) 01/14/2021 2335    Physical Exam:    VS:  BP (!) 162/96   Pulse 66   Ht 5\' 2"  (1.575 m)   Wt 210 lb (95.3 kg)   LMP  (LMP Unknown)   SpO2 97%   BMI 38.41 kg/m     Wt Readings from Last 3 Encounters:  10/21/22 210 lb (95.3 kg)  06/01/22 211 lb (95.7 kg)  03/16/22 206 lb 9.6 oz (93.7 kg)     GEN: Well nourished, well developed in no acute distress HEENT: Normal NECK: No JVD; No carotid bruits LYMPHATICS: No lymphadenopathy CARDIAC: S1S2 noted,RRR, no murmurs, rubs, gallops RESPIRATORY:  Clear to auscultation without rales, wheezing or rhonchi  ABDOMEN: Soft, non-tender, non-distended, +bowel sounds, no guarding. EXTREMITIES: No edema, No cyanosis, no clubbing MUSCULOSKELETAL:  No deformity  SKIN: Warm and dry NEUROLOGIC:  Alert and oriented x 3, non-focal PSYCHIATRIC:  Normal affect, good insight  ASSESSMENT:    1. Medication management   2. PVC (premature ventricular contraction)   3. Mixed hyperlipidemia   4. PSVT (paroxysmal supraventricular tachycardia)     PLAN:    Since she is having symptoms we will keep her on the flecainide 50 mg twice daily along with a beta-blocker.  What I like to do today is Lower to get a flecainide level.  She is hypertensive in the office today.  But tells me that her blood pressure at home is within normal.  To avoid medication-induced hypotension been asked the patient to take her blood pressure for me send me that information in 2 weeks.  If that still elevated greater than 130/80 we will adjust antihypertensive medication.  The patient understands the need to lose weight with diet and exercise. We have discussed specific strategies for this.  The patient is in agreement with  the above plan. The patient left the office in stable condition.  The patient will follow up in 6 months or sooner if needed.   Medication Adjustments/Labs and Tests Ordered: Current medicines are reviewed at length with the patient today.  Concerns regarding medicines are outlined above.  Orders Placed This Encounter  Procedures   Flecainide level   No orders of the defined types were placed in this encounter.   Patient Instructions  Medication Instructions:  Your physician recommends that you continue on your current medications as directed. Please refer to the Current Medication list given to you today.  *If you need a refill on your cardiac medications before your next appointment, please call your pharmacy*   Lab Work: Your physician recommends that you have the following labs drawn today: Flecainide level  If you have labs (blood work) drawn today and your tests are completely normal, you will receive your results only by: MyChart Message (if you have MyChart) OR A paper copy in the mail If you have any lab test that is abnormal or we need to change your treatment, we will call you to review the results.   Testing/Procedures: NONE   Follow-Up: At [facility], you and your health needs are our priority.  As part of our continuing mission to provide you with exceptional heart care, we have created designated Provider Care Teams.  These Care Teams include your primary Cardiologist (physician) and Advanced Practice Providers (APPs -  Physician Assistants and Nurse Practitioners) who all work together to provide you with the care you need, when you need it.  We recommend signing up for the patient portal called "MyChart".  Sign up information is provided on this After Visit Summary.  MyChart is used to connect with patients for Virtual Visits (Telemedicine).  Patients are able to view lab/test results, encounter notes, upcoming appointments, etc.  Non-urgent messages can  be sent to your provider as well.   To learn more about what you can do with MyChart, go to [email or web address].    Your next appointment:   1 year(s)  The format for your next appointment:   In Person  Provider:   Thomasene Ripple, DO      Adopting a Healthy Lifestyle.  Know what a healthy weight is for you (roughly BMI <25) and aim to maintain this   Aim for 7+ servings of fruits and vegetables daily   65-80+ fluid ounces of water or unsweet tea for healthy kidneys   Limit to max 1 drink of alcohol per day; avoid smoking/tobacco   Limit animal fats in diet for cholesterol and heart health - choose grass fed whenever available   Avoid highly processed foods, and foods high in saturated/trans fats   Aim for low stress - take time to unwind and care for your mental health   Aim for 150 min of moderate intensity exercise weekly for heart health, and weights twice weekly for bone health   Aim for 7-9 hours of sleep daily   When it comes to diets, agreement about the perfect plan isnt easy to find, even among the experts. Experts at the [facility] [employer] developed an idea known as the Healthy Eating Plate. Just imagine a plate divided into logical, healthy portions.   The emphasis is on diet quality:   Load up on vegetables and fruits - one-half of your plate: Aim for color and variety, and remember that potatoes dont count.   Go for whole grains - one-quarter of your plate: Whole wheat, barley, wheat berries, quinoa, oats,  brown rice, and foods made with them. If you want pasta, go with whole wheat pasta.   Protein power - one-quarter of your plate: Fish, chicken, beans, and nuts are all healthy, versatile protein sources. Limit red meat.   The diet, however, does go beyond the plate, offering a few other suggestions.   Use healthy plant oils, such as olive, canola, soy, corn, sunflower and peanut. Check the labels, and avoid partially hydrogenated oil, which  have unhealthy trans fats.   If youre thirsty, drink water. Coffee and tea are good in moderation, but skip sugary drinks and limit milk and dairy products to one or two daily servings.   The type of carbohydrate in the diet is more important than the amount. Some sources of carbohydrates, such as vegetables, fruits, whole grains, and beans-are healthier than others.   Finally, stay active  Signed, Thomasene Ripple, DO  10/21/2022 9:50 AM    [facility]

## 2022-10-28 ENCOUNTER — Encounter: Payer: Self-pay | Admitting: Cardiology

## 2022-11-02 LAB — FLECAINIDE LEVEL: Flecainide: 0.23 ug/ml (ref 0.20–1.00)

## 2023-01-04 DIAGNOSIS — G4733 Obstructive sleep apnea (adult) (pediatric): Secondary | ICD-10-CM | POA: Diagnosis not present

## 2023-03-20 DIAGNOSIS — G4733 Obstructive sleep apnea (adult) (pediatric): Secondary | ICD-10-CM | POA: Diagnosis not present

## 2023-04-05 DIAGNOSIS — G4733 Obstructive sleep apnea (adult) (pediatric): Secondary | ICD-10-CM | POA: Diagnosis not present

## 2023-04-15 ENCOUNTER — Other Ambulatory Visit: Payer: Self-pay | Admitting: Cardiology

## 2023-05-31 DIAGNOSIS — E1165 Type 2 diabetes mellitus with hyperglycemia: Secondary | ICD-10-CM | POA: Diagnosis not present

## 2023-05-31 DIAGNOSIS — E782 Mixed hyperlipidemia: Secondary | ICD-10-CM | POA: Diagnosis not present

## 2023-05-31 DIAGNOSIS — I10 Essential (primary) hypertension: Secondary | ICD-10-CM | POA: Diagnosis not present

## 2023-05-31 DIAGNOSIS — Z0000 Encounter for general adult medical examination without abnormal findings: Secondary | ICD-10-CM | POA: Diagnosis not present

## 2023-05-31 DIAGNOSIS — E039 Hypothyroidism, unspecified: Secondary | ICD-10-CM | POA: Diagnosis not present

## 2023-05-31 DIAGNOSIS — E559 Vitamin D deficiency, unspecified: Secondary | ICD-10-CM | POA: Diagnosis not present

## 2023-05-31 DIAGNOSIS — R413 Other amnesia: Secondary | ICD-10-CM | POA: Diagnosis not present

## 2023-07-14 ENCOUNTER — Other Ambulatory Visit: Payer: Self-pay | Admitting: Family Medicine

## 2023-07-14 DIAGNOSIS — Z1231 Encounter for screening mammogram for malignant neoplasm of breast: Secondary | ICD-10-CM

## 2023-08-15 ENCOUNTER — Ambulatory Visit
Admission: RE | Admit: 2023-08-15 | Discharge: 2023-08-15 | Disposition: A | Discharge status: Home / Self Care | Payer: BC Managed Care – PPO | Source: Ambulatory Visit

## 2023-08-15 DIAGNOSIS — Z1231 Encounter for screening mammogram for malignant neoplasm of breast: Secondary | ICD-10-CM

## 2023-10-05 DIAGNOSIS — G4733 Obstructive sleep apnea (adult) (pediatric): Secondary | ICD-10-CM | POA: Diagnosis not present

## 2023-10-25 ENCOUNTER — Ambulatory Visit: Payer: BC Managed Care – PPO | Attending: Cardiology | Admitting: Cardiology

## 2023-10-25 ENCOUNTER — Encounter: Payer: Self-pay | Admitting: Cardiology

## 2023-10-25 VITALS — BP 152/100 | HR 61 | Ht 62.0 in | Wt 205.4 lb

## 2023-10-25 DIAGNOSIS — Z79899 Other long term (current) drug therapy: Secondary | ICD-10-CM

## 2023-10-25 DIAGNOSIS — I493 Ventricular premature depolarization: Secondary | ICD-10-CM

## 2023-10-25 DIAGNOSIS — I10 Essential (primary) hypertension: Secondary | ICD-10-CM

## 2023-10-25 DIAGNOSIS — I471 Supraventricular tachycardia, unspecified: Secondary | ICD-10-CM | POA: Diagnosis not present

## 2023-10-25 DIAGNOSIS — R002 Palpitations: Secondary | ICD-10-CM | POA: Diagnosis not present

## 2023-10-25 MED ORDER — FLECAINIDE ACETATE 100 MG PO TABS: 100.0000 mg | ORAL_TABLET | Freq: Two times a day (BID) | ORAL | 3 refills | Status: DC

## 2023-10-25 NOTE — Patient Instructions (Addendum)
Medication Instructions:  Your physician has recommended you make the following change in your medication:  INCREASE: Flecainide 100 mg twice daily  Please take your blood pressure daily for 2 weeks and send in a MyChart message. Please include heart rates.   HOW TO TAKE YOUR BLOOD PRESSURE: Rest 5 minutes before taking your blood pressure. Don't smoke or drink caffeinated beverages for at least 30 minutes before. Take your blood pressure before (not after) you eat. Sit comfortably with your back supported and both feet on the floor (don't cross your legs). Elevate your arm to heart level on a table or a desk. Use the proper sized cuff. It should fit smoothly and snugly around your bare upper arm. There should be enough room to slip a fingertip under the cuff. The bottom edge of the cuff should be 1 inch above the crease of the elbow. Ideally, take 3 measurements at one sitting and record the average.  Lab Work: Flecainide If you have labs (blood work) drawn today and your tests are completely normal, you will receive your results only by: MyChart Message (if you have MyChart) OR A paper copy in the mail If you have any lab test that is abnormal or we need to change your treatment, we will call you to review the results.   Testing/Procedures: None   Follow-Up: At [facility], you and your health needs are our priority.  As part of our continuing mission to provide you with exceptional heart care, we have created designated Provider Care Teams.  These Care Teams include your primary Cardiologist (physician) and Advanced Practice Providers (APPs -  Physician Assistants and Nurse Practitioners) who all work together to provide you with the care you need, when you need it.  Your next appointment:   9 month(s)  Provider:   Thomasene Ripple, DO     Other Instructions You are invited to attend: [facility] event on February 1st at [facility] [place],  [place], [place] [address]) from 8am-12pm. Feel free to invite other women to attend!  See you there!

## 2023-10-25 NOTE — Progress Notes (Signed)
Cardiology Office Note:    Date:  10/25/2023   ID:  SHALEIGH PADLEY, DOB 24-Jul-1961, MRN [id number]  PCP:  Noberto Retort, MD  Cardiologist:  Thomasene Ripple, DO  Electrophysiologist:  None   Referring MD: Noberto Retort, MD   " I am still short of breath"  History of Present Illness:    Norma [name] is a 62 y.o. female with a hx of PSVT, type 2 diabetes, morbid obesity, hyperlipidemia, hypertension, history of COVID-19 infection now 2 episodes of COVID first in 2022 which led to chronic respiratory failure where she needed oxygen and was eventually titrated off oxygen, long COVID syndrome, OSA on CPAP.  She presents with a recent increase in PVCs. She describes her heart as feeling "yucky" and "not good." She has not felt PVCs in a long time, but recently experienced an increase in symptoms. She is currently on Toprol 100mg , amlodipine, and flecainide. She had caffeine and chocolate on Monday night, which is unusual for her.  In addition to her cardiac symptoms, she also reports high blood pressure readings at home, particularly when she is experiencing PVCs. However, she notes that her blood pressure does come down afterwards.   Past Medical History:  Diagnosis Date   Arthritis    hips   Asthma    Costochondritis    Diabetes (HCC)    GERD (gastroesophageal reflux disease)    Headache    HTN (hypertension)    Hyperlipidemia    Obesity    Palpitations    Paresthesias    on Neurontin   Pinched nerve in neck    pt thinks it's between C3 and C4   PONV (postoperative nausea and vomiting)    PVC (premature ventricular contraction)    Sleep apnea    on CPAP; now moderate.     Past Surgical History:  Procedure Laterality Date   BREAST MASS EXCISION Left    CESAREAN SECTION     CHOLECYSTECTOMY N/A 09/23/2016   Procedure: LAPAROSCOPIC CHOLECYSTECTOMY WITH INTRAOPERATIVE CHOLANGIOGRAM;  Surgeon: Luretha Murphy, MD;  Location: [facility];  Service: General;  Laterality: N/A;    EYE SURGERY     to correct cross sidedness    novasure procedure      RADIOACTIVE SEED GUIDED EXCISIONAL BREAST BIOPSY Left 07/20/2018   Procedure: RADIOACTIVE SEED GUIDED EXCISIONAL BREAST BIOPSY;  Surgeon: Luretha Murphy, MD;  Location: [facility];  Service: General;  Laterality: Left;   RIGHT/LEFT HEART CATH AND CORONARY ANGIOGRAPHY N/A 03/02/2022   Procedure: RIGHT/LEFT HEART CATH AND CORONARY ANGIOGRAPHY;  Surgeon: Orbie Pyo, MD;  Location: [facility];  Service: Cardiovascular;  Laterality: N/A;   TUBAL LIGATION      Current Medications: Current Meds  Medication Sig   albuterol (PROVENTIL HFA;VENTOLIN HFA) 108 (90 Base) MCG/ACT inhaler Inhale 2 puffs into the lungs every 6 (six) hours as needed for wheezing or shortness of breath.   amLODipine (NORVASC) 5 MG tablet Take 5 mg by mouth daily.   aspirin 81 MG tablet Take 81 mg by mouth daily.   Cholecalciferol (DIALYVITE VITAMIN D 5000) 125 MCG (5000 UT) capsule Take 5,000 Units by mouth daily.   cyclobenzaprine (FLEXERIL) 5 MG tablet Take 5 mg by mouth at bedtime as needed for muscle spasms.   Digestive Enzyme CAPS Take 1 capsule by mouth 3 (three) times daily before meals.   flecainide (TAMBOCOR) 100 MG tablet Take 1 tablet (100 mg total) by mouth 2 (two) times daily.  gabapentin (NEURONTIN) 600 MG tablet Take 600 mg by mouth at bedtime.   Lancets (ONETOUCH DELICA PLUS LANCET33G) MISC Apply 1 each topically daily.   metFORMIN (GLUCOPHAGE-XR) 500 MG 24 hr tablet Take 1,000 mg by mouth daily with breakfast.   metoprolol succinate (TOPROL-XL) 50 MG 24 hr tablet Take 100 mg by mouth every evening.   ONETOUCH VERIO test strip 1 each daily.   OVER THE COUNTER MEDICATION Take 1 capsule by mouth with breakfast, with lunch, and with evening meal. lipo-gen   OVER THE COUNTER MEDICATION Take 1 Scoop by mouth 2 (two) times daily with a meal. Lauricidin   OVER THE COUNTER MEDICATION Take 4 capsules by mouth daily.  thyrotain   OVER THE COUNTER MEDICATION Take 2 capsules by mouth 2 (two) times daily. silymarin forte   OVER THE COUNTER MEDICATION Take 1 capsule by mouth daily. omegagenics mega 10   OVER THE COUNTER MEDICATION Take 2 capsules by mouth daily with lunch. blood sugar support   OVER THE COUNTER MEDICATION Take 1 Scoop by mouth daily. glutashield   valsartan (DIOVAN) 320 MG tablet Take 320 mg by mouth daily.   [DISCONTINUED] flecainide (TAMBOCOR) 50 MG tablet TAKE ONE TABLET BY MOUTH TWICE A DAY     Allergies:   Beconase aq [beclomethasone diprop monohyd], Atenolol, Other, Cephalexin, and Codeine   Social History   Socioeconomic History   Marital status: Married    Spouse name: Not on file   Number of children: 4   Years of education: Not on file   Highest education level: Bachelor's degree (e.g., BA, AB, BS)  Occupational History   Not on file  Tobacco Use   Smoking status: Never   Smokeless tobacco: Never  Vaping Use   Vaping status: Never Used  Substance and Sexual Activity   Alcohol use: No   Drug use: No   Sexual activity: Not Currently    Birth control/protection: Post-menopausal  Other Topics Concern   Not on file  Social History Narrative   Lives at home with husband    Right handed   Caffeine: none    Social Determinants of Health   Financial Resource Strain: Not on file  Food Insecurity: Not on file  Transportation Needs: Not on file  Physical Activity: Not on file  Stress: Not on file  Social Connections: Unknown (05/08/2022)   Received from [facility]   Social Network    Social Network: Not on file     Family History: The patient's family history includes Heart attack in her father; Multiple sclerosis in her niece; Other in her mother; Stroke in her father. There is no history of Breast cancer.  ROS:   Review of Systems  Constitution: Negative for decreased appetite, fever and weight gain.  HENT: Negative for congestion, ear  discharge, hoarse voice and sore throat.   Eyes: Negative for discharge, redness, vision loss in right eye and visual halos.  Cardiovascular: Reports chest pain, dyspnea on exertion, palpitation.  Negative for leg swelling, orthopnea. Respiratory: Negative for cough, hemoptysis, shortness of breath and snoring.   Endocrine: Negative for heat intolerance and polyphagia.  Hematologic/Lymphatic: Negative for bleeding problem. Does not bruise/bleed easily.  Skin: Negative for flushing, nail changes, rash and suspicious lesions.  Musculoskeletal: Negative for arthritis, joint pain, muscle cramps, myalgias, neck pain and stiffness.  Gastrointestinal: Negative for abdominal pain, bowel incontinence, diarrhea and excessive appetite.  Genitourinary: Negative for decreased libido, genital sores and incomplete emptying.  Neurological: Negative  for brief paralysis, focal weakness, headaches and loss of balance.  Psychiatric/Behavioral: Negative for altered mental status, depression and suicidal ideas.  Allergic/Immunologic: Negative for HIV exposure and persistent infections.    EKGs/Labs/Other Studies Reviewed:    The following studies were reviewed today:   EKG:  None today   April 11, 2022 Patch Wear Time:  14 days and 0 hours (2023-03-25T21:55:54-0400 to 2023-04-08T21:55:58-0400)   Patient had a min HR of 51 bpm, max HR of 174 bpm, and avg HR of 71 bpm. Predominant underlying rhythm was Sinus Rhythm.    65 Supraventricular Tachycardia runs occurred, the run with the fastest interval lasting 16 beats with a max rate of 174 bpm (avg 138  bpm); the run with the fastest interval was also the longest. Supraventricular Tachycardia was detected within +/- 45 seconds of symptomatic patient event(s). Isolated SVEs were rare (<1.0%), SVE Couplets were rare (<1.0%), and SVE Triplets were rare (<1.0%). Isolated VEs were rare (<1.0%, 1389), VE Couplets were rare (<1.0%, 8), and VE Triplets were rare (<1.0%,  1). Ventricular Bigeminy was present.    Symptoms associated with supraventricular tachycardia as well as rare premature ventricular complex.     Conclusion: This study is remarkable for paroxysmal supraventricular tachycardia.   TTE 03/07/2022 IMPRESSIONS   1. Left ventricular ejection fraction, by estimation, is 60 to 65%. The  left ventricle has normal function. The left ventricle has no regional  wall motion abnormalities. Left ventricular diastolic parameters are  consistent with Grade I diastolic  dysfunction (impaired relaxation). The average left ventricular global  longitudinal strain is -24.9 %. The global longitudinal strain is normal.   2. Right ventricular systolic function is normal. The right ventricular  size is normal. Tricuspid regurgitation signal is inadequate for assessing  PA pressure.   3. The mitral valve is grossly normal. Trivial mitral valve  regurgitation.   4. The aortic valve is tricuspid. Aortic valve regurgitation is not  visualized.   5. The inferior vena cava is normal in size with greater than 50%  respiratory variability, suggesting right atrial pressure of 3 mmHg.   6. Cannot exclude a small PFO.   Comparison(s): No prior Echocardiogram.   FINDINGS   Left Ventricle: Left ventricular ejection fraction, by estimation, is 60  to 65%. The left ventricle has normal function. The left ventricle has no  regional wall motion abnormalities. The average left ventricular global  longitudinal strain is -24.9 %.  The global longitudinal strain is normal. The left ventricular internal  cavity size was normal in size. There is no left ventricular hypertrophy.  Left ventricular diastolic parameters are consistent with Grade I  diastolic dysfunction (impaired  relaxation). Indeterminate filling pressures.   Right Ventricle: The right ventricular size is normal. No increase in  right ventricular wall thickness. Right ventricular systolic function is  normal.  Tricuspid regurgitation signal is inadequate for assessing PA  pressure.   Left Atrium: Left atrial size was normal in size.   Right Atrium: Right atrial size was normal in size.   Pericardium: There is no evidence of pericardial effusion.   Mitral Valve: The mitral valve is grossly normal. Trivial mitral valve  regurgitation.   Tricuspid Valve: The tricuspid valve is grossly normal. Tricuspid valve  regurgitation is trivial.   Aortic Valve: The aortic valve is tricuspid. Aortic valve regurgitation is  not visualized. Aortic valve mean gradient measures 7.0 mmHg. Aortic valve  peak gradient measures 13.3 mmHg. Aortic valve area, by VTI measures 3.01  cm.   Pulmonic Valve: The pulmonic valve was normal in structure. Pulmonic valve  regurgitation is trivial.   Aorta: The aortic root and ascending aorta are structurally normal, with  no evidence of dilitation.   Venous: The inferior vena cava is normal in size with greater than 50%  respiratory variability, suggesting right atrial pressure of 3 mmHg.   IAS/Shunts: Cannot exclude a small PFO.   Cardiac catheterization March 02, 2022   LV end diastolic pressure is normal.   LV end diastolic pressure is normal.   The left ventricular ejection fraction is 55-65% by visual estimate.   1.  Normal right dominant circulation with no obstructive disease. 2.  Normal LVEDP of 16 mmHg with preserved ejection fraction. 3.  Normal cardiac output of 5.96 L/min with an index of 3.0 L/min/m with mean RA pressure of 5 mmHg, mean wedge pressure of 8 mmHg, and PA saturation of 72%.   Recommendations: Consider evaluation for noncardiac etiologies of chest pain and dyspnea.  Recent Labs: No results found for requested labs within last 365 days.  Recent Lipid Panel    Component Value Date/Time   TRIG 151 (H) 01/14/2021 2335    Physical Exam:    VS:  BP (!) 152/100 (BP Location: Right Arm, Patient Position: Sitting, Cuff Size: Normal)    Pulse 61   Ht 5\' 2"  (1.575 m)   Wt 205 lb 6.4 oz (93.2 kg)   LMP  (LMP Unknown)   SpO2 96%   BMI 37.57 kg/m     Wt Readings from Last 3 Encounters:  10/25/23 205 lb 6.4 oz (93.2 kg)  10/21/22 210 lb (95.3 kg)  06/01/22 211 lb (95.7 kg)     GEN: Well nourished, well developed in no acute distress HEENT: Normal NECK: No JVD; No carotid bruits LYMPHATICS: No lymphadenopathy CARDIAC: S1S2 noted,RRR, no murmurs, rubs, gallops RESPIRATORY:  Clear to auscultation without rales, wheezing or rhonchi  ABDOMEN: Soft, non-tender, non-distended, +bowel sounds, no guarding. EXTREMITIES: No edema, No cyanosis, no clubbing MUSCULOSKELETAL:  No deformity  SKIN: Warm and dry NEUROLOGIC:  Alert and oriented x 3, non-focal PSYCHIATRIC:  Normal affect, good insight  ASSESSMENT:    1. Palpitations   2. Primary hypertension   3. Medication management   4. PSVT (paroxysmal supraventricular tachycardia) (HCC)   5. PVC (premature ventricular contraction)   6. Morbid obesity (HCC)     PLAN:    PSVT/ Premature Ventricular Contractions  Increased frequency of PVCs reported. Currently on Flecainide 50mg  and Toprol 100mg . EKG today showed normal rhythm. -Increase Flecainide to 100mg  twice daily. -Check Flecainide level today to ensure therapeutic dosing.  Hypertension Blood pressure elevated in office, but patient reports lower readings at home. -Advise patient to monitor blood pressure at home for the next two weeks and upload readings via MyChart for review. To avoid medication-induced hypotension if that still elevated greater than 130/80 we will adjust antihypertensive medication.  The patient understands the need to lose weight with diet and exercise. We have discussed specific strategies for this.  The patient is in agreement with the above plan. The patient left the office in stable condition.  The patient will follow up in 6 months or sooner if needed.   Medication Adjustments/Labs and  Tests Ordered: Current medicines are reviewed at length with the patient today.  Concerns regarding medicines are outlined above.  Orders Placed This Encounter  Procedures   Flecainide level   EKG 12-Lead   Meds ordered this encounter  Medications   flecainide (TAMBOCOR) 100 MG tablet    Sig: Take 1 tablet (100 mg total) by mouth 2 (two) times daily.    Dispense:  180 tablet    Refill:  3    Dose change    Patient Instructions  Medication Instructions:  Your physician has recommended you make the following change in your medication:  INCREASE: Flecainide 100 mg twice daily  Please take your blood pressure daily for 2 weeks and send in a MyChart message. Please include heart rates.   HOW TO TAKE YOUR BLOOD PRESSURE: Rest 5 minutes before taking your blood pressure. Don't smoke or drink caffeinated beverages for at least 30 minutes before. Take your blood pressure before (not after) you eat. Sit comfortably with your back supported and both feet on the floor (don't cross your legs). Elevate your arm to heart level on a table or a desk. Use the proper sized cuff. It should fit smoothly and snugly around your bare upper arm. There should be enough room to slip a fingertip under the cuff. The bottom edge of the cuff should be 1 inch above the crease of the elbow. Ideally, take 3 measurements at one sitting and record the average.  Lab Work: Flecainide If you have labs (blood work) drawn today and your tests are completely normal, you will receive your results only by: MyChart Message (if you have MyChart) OR A paper copy in the mail If you have any lab test that is abnormal or we need to change your treatment, we will call you to review the results.   Testing/Procedures: None   Follow-Up: At [facility], you and your health needs are our priority.  As part of our continuing mission to provide you with exceptional heart care, we have created designated Provider Care  Teams.  These Care Teams include your primary Cardiologist (physician) and Advanced Practice Providers (APPs -  Physician Assistants and Nurse Practitioners) who all work together to provide you with the care you need, when you need it.  Your next appointment:   9 month(s)  Provider:   Thomasene Ripple, DO     Other Instructions You are invited to attend: [facility] event on February 1st at [facility] [place], [place], [place] [address]) from 8am-12pm. Feel free to invite other women to attend!  See you there!      Adopting a Healthy Lifestyle.  Know what a healthy weight is for you (roughly BMI <25) and aim to maintain this   Aim for 7+ servings of fruits and vegetables daily   65-80+ fluid ounces of water or unsweet tea for healthy kidneys   Limit to max 1 drink of alcohol per day; avoid smoking/tobacco   Limit animal fats in diet for cholesterol and heart health - choose grass fed whenever available   Avoid highly processed foods, and foods high in saturated/trans fats   Aim for low stress - take time to unwind and care for your mental health   Aim for 150 min of moderate intensity exercise weekly for heart health, and weights twice weekly for bone health   Aim for 7-9 hours of sleep daily   When it comes to diets, agreement about the perfect plan isnt easy to find, even among the experts. Experts at the [facility] [employer] developed an idea known as the Healthy Eating Plate. Just imagine a plate divided into logical, healthy portions.   The emphasis is on  diet quality:   Load up on vegetables and fruits - one-half of your plate: Aim for color and variety, and remember that potatoes dont count.   Go for whole grains - one-quarter of your plate: Whole wheat, barley, wheat berries, quinoa, oats, brown rice, and foods made with them. If you want pasta, go with whole wheat pasta.   Protein power - one-quarter of your plate: Fish, chicken,  beans, and nuts are all healthy, versatile protein sources. Limit red meat.   The diet, however, does go beyond the plate, offering a few other suggestions.   Use healthy plant oils, such as olive, canola, soy, corn, sunflower and peanut. Check the labels, and avoid partially hydrogenated oil, which have unhealthy trans fats.   If youre thirsty, drink water. Coffee and tea are good in moderation, but skip sugary drinks and limit milk and dairy products to one or two daily servings.   The type of carbohydrate in the diet is more important than the amount. Some sources of carbohydrates, such as vegetables, fruits, whole grains, and beans-are healthier than others.   Finally, stay active  Signed, Thomasene Ripple, DO  10/25/2023 8:16 PM    [facility]

## 2023-10-27 ENCOUNTER — Other Ambulatory Visit: Payer: Self-pay | Admitting: Cardiology

## 2023-10-27 MED ORDER — NITROGLYCERIN 0.4 MG SL SUBL
0.4000 mg | SUBLINGUAL_TABLET | SUBLINGUAL | 4 refills | Status: AC | PRN
Start: 2023-10-27 — End: 2024-11-15

## 2023-10-27 NOTE — Telephone Encounter (Signed)
Sent prescription to pharmacy.  

## 2023-10-31 LAB — FLECAINIDE LEVEL: Flecainide: 0.15 ug/mL — ABNORMAL LOW (ref 0.20–1.00)

## 2023-11-02 ENCOUNTER — Telehealth: Payer: Self-pay | Admitting: Cardiology

## 2023-11-02 NOTE — Telephone Encounter (Signed)
Spoke with patient and she states she feels like she is having PVC's but her Norma [name] mobile is saying she is in sinus rhythm. HR- currently 70. BP 110/66 this morning. States she has been having PVC's everyday this week. Today it has lasted longer than normal. She is taking her medication as prescribed. Denies any SOB, chest pain, headache, nausea or vomiting. Will forward to provider

## 2023-11-02 NOTE — Telephone Encounter (Signed)
Patient c/o Palpitations:  High priority if patient c/o lightheadedness, shortness of breath, or chest pain  How long have you had palpitations/irregular HR/ Afib? Are you having the symptoms now? Since around 10:35 this morning  Are you currently experiencing lightheadedness, SOB or CP? No  Do you have a history of afib (atrial fibrillation) or irregular heart rhythm? Pt unsure   Have you checked your BP or HR? (document readings if available): No  Are you experiencing any other symptoms? No

## 2023-11-03 ENCOUNTER — Encounter: Payer: Self-pay | Admitting: Cardiology

## 2023-11-07 ENCOUNTER — Ambulatory Visit: Payer: BC Managed Care – PPO | Attending: Cardiology

## 2023-11-07 ENCOUNTER — Other Ambulatory Visit: Payer: Self-pay

## 2023-11-07 DIAGNOSIS — I493 Ventricular premature depolarization: Secondary | ICD-10-CM

## 2023-11-07 NOTE — Progress Notes (Unsigned)
Enrolled patient for a 14 day Zio XT  monitor to be mailed to patients home  °

## 2023-11-07 NOTE — Progress Notes (Signed)
Order for Zio placed per Dr. Mallory Shirk request.

## 2023-11-07 NOTE — Telephone Encounter (Signed)
Called pt, made aware Dr. Servando Salina would like her to wear a 14 days Zio monitor. She verbalized understanding. Order placed for Zio.

## 2023-11-09 DIAGNOSIS — I493 Ventricular premature depolarization: Secondary | ICD-10-CM

## 2023-11-10 ENCOUNTER — Encounter: Payer: Self-pay | Admitting: Cardiology

## 2023-11-30 DIAGNOSIS — I493 Ventricular premature depolarization: Secondary | ICD-10-CM | POA: Diagnosis not present

## 2023-12-05 DIAGNOSIS — E782 Mixed hyperlipidemia: Secondary | ICD-10-CM | POA: Diagnosis not present

## 2023-12-05 DIAGNOSIS — E559 Vitamin D deficiency, unspecified: Secondary | ICD-10-CM | POA: Diagnosis not present

## 2023-12-05 DIAGNOSIS — E039 Hypothyroidism, unspecified: Secondary | ICD-10-CM | POA: Diagnosis not present

## 2023-12-05 DIAGNOSIS — I10 Essential (primary) hypertension: Secondary | ICD-10-CM | POA: Diagnosis not present

## 2023-12-05 DIAGNOSIS — E1165 Type 2 diabetes mellitus with hyperglycemia: Secondary | ICD-10-CM | POA: Diagnosis not present

## 2023-12-05 DIAGNOSIS — R5383 Other fatigue: Secondary | ICD-10-CM | POA: Diagnosis not present

## 2024-03-28 ENCOUNTER — Emergency Department (HOSPITAL_BASED_OUTPATIENT_CLINIC_OR_DEPARTMENT_OTHER): Admitting: Radiology

## 2024-03-28 ENCOUNTER — Encounter (HOSPITAL_BASED_OUTPATIENT_CLINIC_OR_DEPARTMENT_OTHER): Payer: Self-pay

## 2024-03-28 ENCOUNTER — Other Ambulatory Visit: Payer: Self-pay

## 2024-03-28 ENCOUNTER — Emergency Department (HOSPITAL_BASED_OUTPATIENT_CLINIC_OR_DEPARTMENT_OTHER)
Admission: EM | Admit: 2024-03-28 | Discharge: 2024-03-29 | Disposition: A | Discharge status: Home / Self Care | Attending: Emergency Medicine | Admitting: Emergency Medicine

## 2024-03-28 DIAGNOSIS — R0781 Pleurodynia: Secondary | ICD-10-CM | POA: Diagnosis present

## 2024-03-28 DIAGNOSIS — E119 Type 2 diabetes mellitus without complications: Secondary | ICD-10-CM | POA: Diagnosis not present

## 2024-03-28 DIAGNOSIS — Z79899 Other long term (current) drug therapy: Secondary | ICD-10-CM | POA: Insufficient documentation

## 2024-03-28 DIAGNOSIS — Z7982 Long term (current) use of aspirin: Secondary | ICD-10-CM | POA: Insufficient documentation

## 2024-03-28 DIAGNOSIS — I10 Essential (primary) hypertension: Secondary | ICD-10-CM | POA: Insufficient documentation

## 2024-03-28 DIAGNOSIS — Z7984 Long term (current) use of oral hypoglycemic drugs: Secondary | ICD-10-CM | POA: Diagnosis not present

## 2024-03-28 LAB — CBC
HCT: 43.1 % (ref 36.0–46.0)
Hemoglobin: 15 g/dL (ref 12.0–15.0)
MCH: 29.1 pg (ref 26.0–34.0)
MCHC: 34.8 g/dL (ref 30.0–36.0)
MCV: 83.7 fL (ref 80.0–100.0)
Platelets: 212 10*3/uL (ref 150–400)
RBC: 5.15 MIL/uL — ABNORMAL HIGH (ref 3.87–5.11)
RDW: 13.7 % (ref 11.5–15.5)
WBC: 8.4 10*3/uL (ref 4.0–10.5)
nRBC: 0 % (ref 0.0–0.2)

## 2024-03-28 LAB — BASIC METABOLIC PANEL WITH GFR
Anion gap: 10 (ref 5–15)
BUN: 13 mg/dL (ref 8–23)
CO2: 27 mmol/L (ref 22–32)
Calcium: 10 mg/dL (ref 8.9–10.3)
Chloride: 104 mmol/L (ref 98–111)
Creatinine, Ser: 0.55 mg/dL (ref 0.44–1.00)
GFR, Estimated: >60 mL/min (ref >60)
Glucose, Bld: 153 mg/dL — ABNORMAL HIGH (ref 70–99)
Potassium: 3.8 mmol/L (ref 3.5–5.1)
Sodium: 141 mmol/L (ref 135–145)

## 2024-03-28 LAB — TROPONIN I (HIGH SENSITIVITY): Troponin I (High Sensitivity): 4 ng/L (ref <18)

## 2024-03-28 NOTE — ED Triage Notes (Signed)
 Says she has bee having rib pains for several weeks with history of them becoming displaced. Sees Chiropractor for management.   Says tonight she stood up and heard a pop and began having pain in the RUQ/ rib area. Pain worsened with movement.   Chiropractor encouraged to ED for XR, and full spine XR as they suspect there is more going on.

## 2024-03-29 ENCOUNTER — Emergency Department (HOSPITAL_BASED_OUTPATIENT_CLINIC_OR_DEPARTMENT_OTHER)

## 2024-03-29 LAB — HEPATIC FUNCTION PANEL
ALT: 38 U/L (ref 0–44)
AST: 23 U/L (ref 15–41)
Albumin: 4.8 g/dL (ref 3.5–5.0)
Alkaline Phosphatase: 75 U/L (ref 38–126)
Bilirubin, Direct: 0.1 mg/dL (ref 0.0–0.2)
Indirect Bilirubin: 0.6 mg/dL (ref 0.3–0.9)
Total Bilirubin: 0.7 mg/dL (ref 0.0–1.2)
Total Protein: 7.5 g/dL (ref 6.5–8.1)

## 2024-03-29 LAB — D-DIMER, QUANTITATIVE: D-Dimer, Quant: 0.46 ug{FEU}/mL (ref 0.00–0.50)

## 2024-03-29 LAB — TROPONIN I (HIGH SENSITIVITY): Troponin I (High Sensitivity): 4 ng/L (ref <18)

## 2024-03-29 LAB — LIPASE, BLOOD: Lipase: 53 U/L — ABNORMAL HIGH (ref 11–51)

## 2024-03-29 MED ORDER — LIDOCAINE 5 % EX PTCH: 1.0000 | MEDICATED_PATCH | CUTANEOUS | 0 refills | Status: DC

## 2024-03-29 MED ORDER — KETOROLAC TROMETHAMINE 30 MG/ML IJ SOLN
15.0000 mg | Freq: Once | INTRAMUSCULAR | Status: AC
  Administered 2024-03-29: 15 mg via INTRAVENOUS
  Filled 2024-03-29: qty 1

## 2024-03-29 MED ORDER — FENTANYL CITRATE PF 50 MCG/ML IJ SOSY
50.0000 ug | PREFILLED_SYRINGE | Freq: Once | INTRAMUSCULAR | Status: AC
  Administered 2024-03-29: 50 ug via INTRAVENOUS
  Filled 2024-03-29: qty 1

## 2024-03-29 MED ORDER — IOHEXOL 350 MG/ML SOLN
100.0000 mL | Freq: Once | INTRAVENOUS | Status: AC | PRN
  Administered 2024-03-29: 100 mL via INTRAVENOUS

## 2024-03-29 MED ORDER — NAPROXEN 500 MG PO TABS: 500.0000 mg | ORAL_TABLET | Freq: Two times a day (BID) | ORAL | 0 refills | Status: DC | PRN

## 2024-03-29 NOTE — ED Provider Notes (Signed)
 [place] EMERGENCY DEPARTMENT AT [facility] Provider Note   CSN: [id number] Arrival date & time: 03/28/24  2251     History  Chief Complaint  Patient presents with   Right Rib Pain    Norma [name] is a 63 y.o. female.  Patient with a history of diabetes, hypertension, GERD, hyperlipidemia presents with right rib pain.  States she been having issues with her right lower ribs hurting her for the past 1 month or so.  Has seen a chiropractor for adjustments.  Pain became acutely worse today when she was getting out of a chair and she felt sharp stabbing pain underneath her right breast.  Worse with palpation and movement.  Some shortness of breath and pain with breathing.  Took ibuprofen without relief.  No rash.  No fever.  Some pain with breathing and does feel short of breath.  No cough or fever.  Denies any cardiac history.  Denies any rash.  Reports normal catheterization 2 years ago. Pain is worse with palpation and breathing.  Previous cholecystectomy.  The history is provided by the patient.       Home Medications Prior to Admission medications   Medication Sig Start Date End Date Taking? Authorizing Provider  albuterol (PROVENTIL HFA;VENTOLIN HFA) 108 (90 Base) MCG/ACT inhaler Inhale 2 puffs into the lungs every 6 (six) hours as needed for wheezing or shortness of breath.    [provider]  amLODipine (NORVASC) 5 MG tablet Take 5 mg by mouth daily. 02/11/21   [provider]  Ascorbic Acid (VITAMIN C) 1000 MG tablet Take 1,000 mg by mouth daily.    [provider]  aspirin 81 MG tablet Take 81 mg by mouth daily.    [provider]  Cholecalciferol (DIALYVITE VITAMIN D 5000) 125 MCG (5000 UT) capsule Take 5,000 Units by mouth daily.    [provider]  cyclobenzaprine (FLEXERIL) 5 MG tablet Take 5 mg by mouth at bedtime as needed for muscle spasms.    [provider]  Digestive Enzyme CAPS Take 1 capsule by  mouth 3 (three) times daily before meals.    [provider]  escitalopram (LEXAPRO) 5 MG tablet Take 10 mg by mouth daily.  03/10/20   [provider]  flecainide (TAMBOCOR) 100 MG tablet Take 1 tablet (100 mg total) by mouth 2 (two) times daily. 10/25/23   Tobb, Kardie, DO  fluticasone-salmeterol (ADVAIR HFA) 115-21 MCG/ACT inhaler Inhale 2 puffs into the lungs 2 (two) times daily. Patient not taking: Reported on 10/25/2023 06/01/22   Martina Sinner, MD  gabapentin (NEURONTIN) 600 MG tablet Take 600 mg by mouth at bedtime.    [provider]  Lancets [facility] DELICA PLUS [place]) MISC Apply 1 each topically daily. 03/10/20   [provider]  metFORMIN (GLUCOPHAGE-XR) 500 MG 24 hr tablet Take 1,000 mg by mouth daily with breakfast. 03/10/20   [provider]  metoprolol succinate (TOPROL-XL) 50 MG 24 hr tablet Take 100 mg by mouth every evening. 08/08/16   [provider]  nitroGLYCERIN (NITROSTAT) 0.4 MG SL tablet Place 1 tablet (0.4 mg total) under the tongue every 5 (five) minutes as needed for chest pain. Up to 3 times. 10/27/23 01/25/24  Tobb, Kardie, DO  ONETOUCH VERIO test strip 1 each daily. 03/10/20   [provider]  OVER THE COUNTER MEDICATION Take 1 capsule by mouth with breakfast, with lunch, and with evening meal. lipo-gen    [provider]  OVER THE COUNTER MEDICATION Take 1 Scoop by mouth 2 (two) times daily with a meal. Lauricidin    [provider]  OVER THE COUNTER MEDICATION Take 4 capsules by mouth daily. thyrotain    [provider]  OVER THE COUNTER MEDICATION Take 2 capsules by mouth 2 (two) times daily. silymarin forte    [provider]  OVER THE COUNTER MEDICATION Take 1 capsule by mouth daily. omegagenics mega 10    [provider]  OVER THE COUNTER MEDICATION Take 2 capsules by mouth daily with lunch. blood sugar support    [provider]  OVER THE  COUNTER MEDICATION Take 1 Scoop by mouth daily. glutashield    [provider]  valsartan (DIOVAN) 320 MG tablet Take 320 mg by mouth daily.    [provider]      Allergies    Beconase aq [beclomethasone diprop monohyd], Atenolol, Other, Cephalexin, and Codeine    Review of Systems   Review of Systems  Constitutional:  Negative for activity change, appetite change and fever.  HENT:  Negative for congestion and rhinorrhea.   Respiratory:  Positive for chest tightness and shortness of breath.   Cardiovascular:  Negative for chest pain.  Gastrointestinal:  Negative for abdominal pain, diarrhea and vomiting.  Genitourinary:  Negative for dysuria and hematuria.  Musculoskeletal:  Negative for arthralgias and myalgias.  Skin:  Negative for rash.  Neurological:  Negative for dizziness, weakness and headaches.   all other systems are negative except as noted in the HPI and PMH.    Physical Exam Updated Vital Signs BP (!) 159/139 (BP Location: Right Arm)   Pulse 76   Temp 98.2 F (36.8 C) (Oral)   Resp (!) 22   Ht 5\' 2"  (1.575 m)   Wt 92.5 kg   LMP  (LMP Unknown)   SpO2 98%   BMI 37.31 kg/m  Physical Exam Vitals and nursing note reviewed.  Constitutional:      General: She is not in acute distress.    Appearance: She is well-developed. She is obese.  HENT:     Head: Normocephalic and atraumatic.     Mouth/Throat:     Pharynx: No oropharyngeal exudate.  Eyes:     Conjunctiva/sclera: Conjunctivae normal.     Pupils: Pupils are equal, round, and reactive to light.  Neck:     Comments: No meningismus. Cardiovascular:     Rate and Rhythm: Normal rate and regular rhythm.     Heart sounds: Normal heart sounds. No murmur heard. Pulmonary:     Effort: Pulmonary effort is normal. No respiratory distress.     Breath sounds: Normal breath sounds.     Comments: Right lower rib tenderness, no crepitus.  No rash Chest:     Chest wall: Tenderness present.   Abdominal:     Palpations: Abdomen is soft.     Tenderness: There is no abdominal tenderness. There is no guarding or rebound.  Musculoskeletal:        General: No tenderness. Normal range of motion.     Cervical back: Normal range of motion and neck supple.  Skin:    General: Skin is warm.  Neurological:     Mental Status: She is alert and oriented to person, place, and time.     Cranial Nerves: No cranial nerve deficit.     Motor: No abnormal muscle tone.     Coordination: Coordination normal.     Comments:  5/5 strength throughout.  CN 2-12 intact.Equal grip strength.   Psychiatric:        Behavior: Behavior normal.     ED Results / Procedures / Treatments   Labs (all labs ordered are listed, but only abnormal results are displayed) Labs Reviewed  BASIC METABOLIC PANEL WITH GFR - Abnormal; Notable for the following components:      Result Value   Glucose, Bld 153 (*)    All other components within normal limits  CBC - Abnormal; Notable for the following components:   RBC 5.15 (*)    All other components within normal limits  LIPASE, BLOOD - Abnormal; Notable for the following components:   Lipase 53 (*)    All other components within normal limits  D-DIMER, QUANTITATIVE  HEPATIC FUNCTION PANEL  TROPONIN I (HIGH SENSITIVITY)  TROPONIN I (HIGH SENSITIVITY)    EKG EKG Interpretation Date/Time:  Thursday March 28 2024 22:59:32 EDT Ventricular Rate:  60 PR Interval:  166 QRS Duration:  98 QT Interval:  400 QTC Calculation: 400 R Axis:   -47  Text Interpretation: Normal sinus rhythm Left anterior fascicular block Minimal voltage criteria for LVH, may be normal variant ( Cornell product ) Abnormal ECG When compared with ECG of 25-Oct-2023 10:12, No significant change was found No significant change was found Confirmed by Glynn Octave [phone]) on 03/29/2024 12:01:36 AM  Radiology CT ABDOMEN PELVIS W CONTRAST Result Date: 03/29/2024 CLINICAL DATA:  Chest pain,  abdominal pain EXAM: CT ABDOMEN AND PELVIS WITH CONTRAST TECHNIQUE: Multidetector CT imaging of the abdomen and pelvis was performed using the standard protocol following bolus administration of intravenous contrast. RADIATION DOSE REDUCTION: This exam was performed according to the departmental dose-optimization program which includes automated exposure control, adjustment of the mA and/or kV according to patient size and/or use of iterative reconstruction technique. CONTRAST:  OMNIPAQUE IOHEXOL 350 MG/ML SOLN COMPARISON:  None Available. FINDINGS: Lower chest: See chest CT report. Hepatobiliary: No focal liver abnormality is seen. Status post cholecystectomy. No biliary dilatation. Pancreas: No focal abnormality or ductal dilatation. Spleen: No focal abnormality.  Normal size. Adrenals/Urinary Tract: Bilateral renal parapelvic cysts. Left upper pole cyst measures 1.5 cm. These appear benign. No follow-up imaging recommended. No stones or hydronephrosis. Adrenal glands and urinary bladder unremarkable. Normal appendix. Stomach, large and small bowel grossly unremarkable. Stomach/Bowel: No evidence of aneurysm or adenopathy. Vascular/Lymphatic: Uterus and adnexa unremarkable.  No mass. Reproductive: No free fluid or free air. Other: No acute bony abnormality. Musculoskeletal: No acute findings in the abdomen or pelvis. Electronically Signed   By: Charlett Nose M.D.   On: 03/29/2024 01:08   CT Angio Chest PE W and/or Wo Contrast Result Date: 03/29/2024 CLINICAL DATA:  Pulmonary embolism (PE) suspected, high prob. Chest pain EXAM: CT ANGIOGRAPHY CHEST WITH CONTRAST TECHNIQUE: Multidetector CT imaging of the chest was performed using the standard protocol during bolus administration of intravenous contrast. Multiplanar CT image reconstructions and MIPs were obtained to evaluate the vascular anatomy. RADIATION DOSE REDUCTION: This exam was performed according to the departmental dose-optimization program which  includes automated exposure control, adjustment of the mA and/or kV according to patient size and/or use of iterative reconstruction technique. CONTRAST:  OMNIPAQUE IOHEXOL 350 MG/ML SOLN COMPARISON:  01/31/2021 FINDINGS: Cardiovascular: No filling defects in the pulmonary arteries to suggest pulmonary emboli. Heart is normal size. Aorta is normal caliber. Mediastinum/Nodes: No mediastinal, hilar, or axillary adenopathy. Trachea and esophagus are unremarkable. Thyroid unremarkable. Lungs/Pleura: Linear areas of atelectasis or scarring in the lung  bases. No confluent opacities or effusions. Upper Abdomen: No acute findings Musculoskeletal: Chest wall soft tissues are unremarkable. No acute bony abnormality. Review of the MIP images confirms the above findings. IMPRESSION: No evidence of pulmonary embolus. Bibasilar scarring or atelectasis. No active disease. Electronically Signed   By: Charlett Nose M.D.   On: 03/29/2024 01:06   DG Ribs Unilateral W/Chest Right Result Date: 03/28/2024 CLINICAL DATA:  Right rib pain EXAM: RIGHT RIBS AND CHEST - 3+ VIEW COMPARISON:  02/20/2022 FINDINGS: No visible rib fracture. Heart and mediastinal contours within normal limits. Linear scarring at the left base. Right lung clear. No effusion or pneumothorax. IMPRESSION: No acute cardiopulmonary disease. No visible rib fracture. Electronically Signed   By: Charlett Nose M.D.   On: 03/28/2024 23:19    Procedures Procedures    Medications Ordered in ED Medications  ketorolac (TORADOL) 30 MG/ML injection 15 mg (has no administration in time range)  fentaNYL (SUBLIMAZE) injection 50 mcg (has no administration in time range)    ED Course/ Medical Decision Making/ A&P                                 Medical Decision Making Amount and/or Complexity of Data Reviewed Labs: ordered. Decision-making details documented in ED Course. Radiology: ordered and independent interpretation performed. Decision-making details  documented in ED Course. ECG/medicine tests: ordered and independent interpretation performed. Decision-making details documented in ED Course.  Risk Prescription drug management.   Rib pain x 1 month but suddenly worsened tonight with getting out of a chair.  EKG is sinus rhythm.  No acute ST changes.  Right rib pain is reproducible without crepitus or ecchymosis.  Low concern for ACS.  D-dimer is negative with low suspicion for PE. Previous cholecystectomy  EKG is sinus rhythm without acute ST changes.  Chest x-ray is negative for infiltrate or pneumothorax or rib fracture.Marland Kitchen  Results reviewed and interpreted by me.  Right rib pain is reproducible. No overlying rash but consideration for early shingles.  D-dimer is negative with low suspicion for PE.  Troponin negative x 2 with low suspicion for ACS.  CT scan shows no intra-abdominal pathology or pneumonia or pulmonary embolism.  Will treat supportively for suspected musculoskeletal rib pain.  Will treat with anti-inflammatories and topical lidocaine patches.  Low suspicion for ACS.  Testing negative for PE.  Return to the ED with exertional chest pain, pain associate with shortness of breath, nausea, vomiting, sweating or other concerns.        Final Clinical Impression(s) / ED Diagnoses Final diagnoses:  Rib pain    Rx / DC Orders ED Discharge Orders     None         Steven Veazie, Jeannett Senior, MD 03/29/24 0210

## 2024-03-29 NOTE — Discharge Instructions (Signed)
 No evidence of heart attack or blood clot in the lung.  Take the anti-inflammatories as prescribed and follow-up with your primary doctor.  Return to the ED with exertional chest pain, pain associate shortness of breath, nausea, vomiting, sweating or other concerns.

## 2024-04-12 ENCOUNTER — Telehealth: Payer: Self-pay | Admitting: Cardiology

## 2024-04-12 ENCOUNTER — Encounter: Payer: Self-pay | Admitting: *Deleted

## 2024-04-12 NOTE — Telephone Encounter (Signed)
 Pt would like Dr. Emmette Harms to view her echo results from the hospital and give her a c/b. Please advise

## 2024-04-17 NOTE — Telephone Encounter (Signed)
 Called patient and patient and made patient aware no ECHO was found.  Per patient she thought she had echo, but after looking patient had EKG done at hospital visit and would like to discuss that at visit. Office visit made for patient on 4/24 with APP. Made patient aware to call office for any other questions. Understanding verbalized.

## 2024-04-18 ENCOUNTER — Encounter: Payer: Self-pay | Admitting: Emergency Medicine

## 2024-04-18 ENCOUNTER — Ambulatory Visit: Attending: Emergency Medicine | Admitting: Emergency Medicine

## 2024-04-18 VITALS — BP 132/82 | HR 57 | Ht 62.0 in | Wt 202.0 lb

## 2024-04-18 DIAGNOSIS — R0789 Other chest pain: Secondary | ICD-10-CM

## 2024-04-18 DIAGNOSIS — I493 Ventricular premature depolarization: Secondary | ICD-10-CM | POA: Diagnosis not present

## 2024-04-18 DIAGNOSIS — I10 Essential (primary) hypertension: Secondary | ICD-10-CM

## 2024-04-18 DIAGNOSIS — M940 Chondrocostal junction syndrome [Tietze]: Secondary | ICD-10-CM

## 2024-04-18 DIAGNOSIS — Z79899 Other long term (current) drug therapy: Secondary | ICD-10-CM

## 2024-04-18 DIAGNOSIS — I471 Supraventricular tachycardia, unspecified: Secondary | ICD-10-CM | POA: Diagnosis not present

## 2024-04-18 NOTE — Patient Instructions (Signed)
 Medication Instructions:  NO CHANGES   Lab Work: FLECAINIDE  LEVEL TO BE DONE TODAY.  Testing/Procedures: NONE  Follow-Up: At [facility], you and your health needs are our priority.  As part of our continuing mission to provide you with exceptional heart care, our providers are all part of one team.  This team includes your primary Cardiologist (physician) and Advanced Practice Providers or APPs (Physician Assistants and Nurse Practitioners) who all work together to provide you with the care you need, when you need it.  Your next appointment:   6 month(s)  Provider:   Kardie Tobb, DO  OR Palmer Bobo, [place]   We recommend signing up for the patient portal called "MyChart".  Sign up information is provided on this After Visit Summary.  MyChart is used to connect with patients for Virtual Visits (Telemedicine).  Patients are able to view lab/test results, encounter notes, upcoming appointments, etc.  Non-urgent messages can be sent to your provider as well.   To learn more about what you can do with MyChart, go to [email or web address].   Other Instructions:    1st Floor: - Lobby - Registration  - Pharmacy  - Lab - Cafe  2nd Floor: - PV Lab - Diagnostic Testing (echo, CT, nuclear med)  3rd Floor: - Vacant  4th Floor: - TCTS (cardiothoracic surgery) - AFib Clinic - Structural Heart Clinic - Vascular Surgery  - Vascular Ultrasound  5th Floor: - HeartCare Cardiology (general and EP) - Clinical Pharmacy for coumadin, hypertension, lipid, weight-loss medications, and med management appointments    Valet parking services will be available as well.

## 2024-04-18 NOTE — Progress Notes (Signed)
 Cardiology Office Note:    Date:  04/18/2024  ID:  Norma [name], DOB 17-May-1961, MRN [id number] PCP: Sinda Duel, PA  [facility] Providers Cardiologist:  Jerryl Morin, DO       Patient Profile:      Chief Complaint: ED follow-up for rib pain/chest pains History of Present Illness:  Norma [name] is a 63 y.o. female with visit-pertinent history of PSVT, type 2 diabetes, morbid obesity, hyperlipidemia, hypertension, history of COVID-19 infection now 2 episodes of COVID first in 2022 which led to chronic respiratory failure where she needed oxygen and was eventually titrated off oxygen, long COVID syndrome, OSA on CPAP  Patient established with cardiology service on 02/23/2022.  At that time she presented with chest discomfort.  Right and left cardiac catheterization was performed on 03/02/2022 showing normal right dominant circulation with no obstructive disease.  Normal LVEDP of 16 mmHg with preserved ejection fraction of 55-65%.  Echocardiogram performed on 03/07/2022 showing LVEF 60 to 65%, no RWMA, grade 1 DD, RV function and size normal, trivial MR.  She was seen for follow-up on 03/16/2022.  She noted to continue to experience chest pressure and shortness of breath.  In addition she was experiencing palpitations.  ZIO was ordered showing predominant underlying rhythm was sinus rhythm, 65 supraventricular tachycardia runs occurred, the run with the fastest interval lasting 16 beats with a max rate of 174 bpm; the run with the fastest interval was also the longest.  She was started on flecainide  50 mg twice daily.  She was last seen in office on 10/25/2023.  She presented with increase in PVCs.  In addition she reported high blood pressure readings at home particularly when she is experiencing PVCs.  Flecainide  was increased to 100 mg twice daily.  Repeat ZIO was ordered on 11/07/2023 showing 6 supraventricular tachycardia runs, the run with the fastest interval lasting 4 beats with a  max rate of 150 bpm, the longest lasting 14.9 seconds with an average rate of 118 bpm.  She was recently seen in the ED on 03/28/2024 and diagnosed with rib pain.  High-sensitivity troponins negative x 2.  D-dimer was negative.  CT angio chest showed no evidence of pulmonary embolus.  Checks x-ray showed no acute cardiopulmonary disease and no visible rib fracture.  Discussed the use of AI scribe software for clinical note transcription with the patient, who gave verbal consent to proceed.  History of Present Illness The patient, with a history of PVCs and PSVTs, presents for evaluation of rib pain noted during a recent ED visit. The patient reports that the ED visit was prompted by rib pain, which has been ongoing for about eight weeks. The pain is described as a constant ache with intermittent sharp pains, which are usually triggered by certain movements or actions such as reaching or lifting. The patient denies any exertional chest pain at the time of the visit, and reports that the pain does not worsen with exertion. The patient also reports that the rib pain is more often on the right side, but can also occur on the left side. The patient has been managing the pain with prescription strength ibuprofen, taken four tablets every eight hours, but plans to stop this as soon as possible due to concerns about liver health.  Her symptoms were highly suggestive of costochondritis.  She plans to see a chiropractor for pain management.  The patient's PVCs and PSVTs have reportedly improved since the flecainide  dose was increased.  She denies shortness of breath, lower extremity edema, fatigue, palpitations, melena, hematuria, hemoptysis, diaphoresis, weakness, presyncope, syncope, orthopnea, and PND.  Review of systems:  Please see the history of present illness. All other systems are reviewed and otherwise negative.     Home Medications:    Current Meds  Medication Sig   albuterol  (PROVENTIL   HFA;VENTOLIN  HFA) 108 (90 Base) MCG/ACT inhaler Inhale 2 puffs into the lungs every 6 (six) hours as needed for wheezing or shortness of breath.   amLODipine (NORVASC) 5 MG tablet Take 5 mg by mouth daily.   Ascorbic Acid (VITAMIN C) 1000 MG tablet Take 1,000 mg by mouth daily.   aspirin  81 MG tablet Take 81 mg by mouth daily.   Cholecalciferol (DIALYVITE VITAMIN D 5000) 125 MCG (5000 UT) capsule Take 5,000 Units by mouth daily.   cyclobenzaprine (FLEXERIL) 5 MG tablet Take 5 mg by mouth at bedtime as needed for muscle spasms.   Digestive Enzyme CAPS Take 1 capsule by mouth 3 (three) times daily before meals.   escitalopram  (LEXAPRO ) 5 MG tablet Take 10 mg by mouth daily.    flecainide  (TAMBOCOR ) 100 MG tablet Take 1 tablet (100 mg total) by mouth 2 (two) times daily.   fluticasone -salmeterol (ADVAIR HFA) 115-21 MCG/ACT inhaler Inhale 2 puffs into the lungs 2 (two) times daily.   gabapentin  (NEURONTIN ) 600 MG tablet Take 600 mg by mouth at bedtime.   Lancets (ONETOUCH DELICA PLUS LANCET33G) MISC Apply 1 each topically daily.   lidocaine  (LIDODERM ) 5 % Place 1 patch onto the skin daily. Remove & Discard patch within 12 hours or as directed by MD   metFORMIN (GLUCOPHAGE-XR) 500 MG 24 hr tablet Take 1,000 mg by mouth daily with breakfast.   metoprolol  succinate (TOPROL -XL) 50 MG 24 hr tablet Take 100 mg by mouth every evening.   naproxen  (NAPROSYN ) 500 MG tablet Take 1 tablet (500 mg total) by mouth 2 (two) times daily as needed.   ONETOUCH VERIO test strip 1 each daily.   OVER THE COUNTER MEDICATION Take 1 capsule by mouth with breakfast, with lunch, and with evening meal. lipo-gen   OVER THE COUNTER MEDICATION Take 1 Scoop by mouth 2 (two) times daily with a meal. Lauricidin   OVER THE COUNTER MEDICATION Take 4 capsules by mouth daily. thyrotain   OVER THE COUNTER MEDICATION Take 2 capsules by mouth 2 (two) times daily. silymarin forte   OVER THE COUNTER MEDICATION Take 1 capsule by mouth daily.  omegagenics mega 10   OVER THE COUNTER MEDICATION Take 2 capsules by mouth daily with lunch. blood sugar support   OVER THE COUNTER MEDICATION Take 1 Scoop by mouth daily. glutashield   valsartan (DIOVAN) 320 MG tablet Take 320 mg by mouth daily.   Studies Reviewed:       ZIO 11/30/2019 Patch Wear Time:  13 days and 23 hours (2024-11-14T20:37:04-499 to 2024-11-28T20:37:12-498)   Patient had a min HR of 44 bpm, max HR of 150 bpm, and avg HR of 62 bpm. Predominant underlying rhythm was Sinus Rhythm.    6 Supraventricular Tachycardia runs occurred, the run with the fastest interval lasting 4 beats with a max rate of 150 bpm, the longest lasting 14.9 secs with an avg rate of 118 bpm. Supraventricular Tachycardia was detected within +/- 45 seconds of symptomatic patient event(s). Isolated SVEs were rare (<1.0%), SVE Couplets were rare (<1.0%), and SVE Triplets were rare (<1.0%). Isolated VEs were rare (<1.0%), VE Couplets were rare (<1.0%), and no VE  Triplets were present. Ventricular Bigeminy was present.   ZIO 03/16/2022 Patch Wear Time:  14 days and 0 hours (2023-03-25T21:55:54-0400 to 2023-04-08T21:55:58-0400)   Patient had a min HR of 51 bpm, max HR of 174 bpm, and avg HR of 71 bpm. Predominant underlying rhythm was Sinus Rhythm.    65 Supraventricular Tachycardia runs occurred, the run with the fastest interval lasting 16 beats with a max rate of 174 bpm (avg 138  bpm); the run with the fastest interval was also the longest. Supraventricular Tachycardia was detected within +/- 45 seconds of symptomatic patient event(s). Isolated SVEs were rare (<1.0%), SVE Couplets were rare (<1.0%), and SVE Triplets were rare (<1.0%). Isolated VEs were rare (<1.0%, 1389), VE Couplets were rare (<1.0%, 8), and VE Triplets were rare (<1.0%, 1). Ventricular Bigeminy was present.   Right/left cardiac catheterization 03/02/2022   LV end diastolic pressure is normal.   LV end diastolic pressure is normal.   The  left ventricular ejection fraction is 55-65% by visual estimate.   1.  Normal right dominant circulation with no obstructive disease. 2.  Normal LVEDP of 16 mmHg with preserved ejection fraction. 3.  Normal cardiac output of 5.96 L/min with an index of 3.0 L/min/m with mean RA pressure of 5 mmHg, mean wedge pressure of 8 mmHg, and PA saturation of 72%. Diagnostic Dominance: Right  Risk Assessment/Calculations:             Physical Exam:   VS:  BP 132/82 (BP Location: Left Arm, Patient Position: Sitting, Cuff Size: Normal)   Pulse (!) 57   Ht 5\' 2"  (1.575 m)   Wt 202 lb (91.6 kg)   LMP  (LMP Unknown)   BMI 36.95 kg/m    Wt Readings from Last 3 Encounters:  04/18/24 202 lb (91.6 kg)  03/28/24 204 lb (92.5 kg)  10/25/23 205 lb 6.4 oz (93.2 kg)    GEN: Well nourished, well developed in no acute distress NECK: No JVD; No carotid bruits CARDIAC: RRR, no murmurs, rubs, gallops RESPIRATORY:  Clear to auscultation without rales, wheezing or rhonchi  ABDOMEN: Soft, non-tender, non-distended EXTREMITIES:  No edema; No acute deformity     Assessment and Plan:  Paroxysmal supraventricular tachycardia ZIO 02/2022 showed 65 SVT runs occurred Repeat ZIO 10/2023 showed 6 SVT runs occurred She denies any recurrent palpitations or tachycardia that would be concerning for recurrent SVT.  Overall SVT seems to be well-controlled - Continue metoprolol  XL 100 mg daily and flecainide  100 mg twice daily  PVC ZIO 03/14/2022 and 11/15/2023 with PVC burden less than 1% Well-controlled on flecainide  100 mg twice daily and metoprolol  XL 100 mg daily - EKG today without PVC - Continue current medication regimen  Hypertension Blood pressure today is 132/82 Blood pressure not currently at goal of less than 130/80.  However blood pressure under much better control in regards to prior office visits (152/100 on 09/2023) - No medication changes today.  She will work on heart healthy dieting, exercise, and  weight loss to reach her goal - Continue amlodipine 5 mg daily, metoprolol  XL 100 mg daily, valsartan 320 mg daily  Costochondritis / Atypical chest pain Seen in ED on 03/28/2024 for rib pain/chest pain.  Onset 8 weeks ago with ongoing dull chest pains.  Her pain is reproduced on movement.  No exertional symptoms.  Her high-sensitivity troponin were unremarkable and EKG was nonischemic.  Her symptoms are suggestive of costochondritis as noted in HPI. - R/L cardiac catheterization 02/2022 with normal  coronaries - EKG today without ischemic changes - She is without any anginal symptoms, no indication for further ischemic evaluation - She is okay to be evaluated by chiropractor from cardiology standpoint - Managed by PCP      Dispo:  Return in about 6 months (around 10/18/2024).  Signed, Ava Boatman, NP

## 2024-04-23 LAB — FLECAINIDE LEVEL: Flecainide: 0.47 ug/mL (ref 0.20–1.00)

## 2024-05-16 ENCOUNTER — Ambulatory Visit: Admitting: Student

## 2024-10-20 ENCOUNTER — Other Ambulatory Visit: Payer: Self-pay | Admitting: Cardiology

## 2024-10-28 ENCOUNTER — Telehealth: Payer: Self-pay | Admitting: Cardiology

## 2024-10-28 MED ORDER — FLECAINIDE ACETATE 100 MG PO TABS: 100.0000 mg | ORAL_TABLET | Freq: Two times a day (BID) | ORAL | 0 refills | Status: DC

## 2024-10-28 NOTE — Telephone Encounter (Signed)
 Pt scheduled for follow-up 01/16/24.  Refill for Flecainide  has been sent to [facility].

## 2024-10-28 NOTE — Telephone Encounter (Signed)
*  STAT* If patient is at the pharmacy, call can be transferred to refill team.   1. Which medications need to be refilled? (please list name of each medication and dose if known)   flecainide  (TAMBOCOR ) 100 MG tablet   2. Would you like to learn more about the convenience, safety, & potential cost savings by using the [facility]?   3. Are you open to using the [facility] (Type [facility]. ).  4. Which pharmacy/location (including street and city if local pharmacy) is medication to be sent to?  [facility] [id number] - [place], [place] - [address]   5. Do they need a 30 day or 90 day supply?   90 day  Patient stated she still has some medication left.  Patient has appointment scheduled with M. Rana, NP on 11/21.

## 2024-11-13 ENCOUNTER — Encounter: Payer: Self-pay | Admitting: *Deleted

## 2024-11-15 ENCOUNTER — Ambulatory Visit: Attending: Emergency Medicine | Admitting: Emergency Medicine

## 2024-11-15 ENCOUNTER — Encounter: Payer: Self-pay | Admitting: Emergency Medicine

## 2024-11-15 VITALS — BP 112/70 | HR 61 | Ht 62.0 in | Wt 204.0 lb

## 2024-11-15 DIAGNOSIS — G4733 Obstructive sleep apnea (adult) (pediatric): Secondary | ICD-10-CM

## 2024-11-15 DIAGNOSIS — I10 Essential (primary) hypertension: Secondary | ICD-10-CM

## 2024-11-15 DIAGNOSIS — Z79899 Other long term (current) drug therapy: Secondary | ICD-10-CM

## 2024-11-15 DIAGNOSIS — I493 Ventricular premature depolarization: Secondary | ICD-10-CM

## 2024-11-15 DIAGNOSIS — R0789 Other chest pain: Secondary | ICD-10-CM

## 2024-11-15 DIAGNOSIS — I471 Supraventricular tachycardia, unspecified: Secondary | ICD-10-CM | POA: Diagnosis not present

## 2024-11-15 NOTE — Patient Instructions (Signed)
 Medication Instructions:  NO CHANGES  Lab Work: CMET AND MAGNESIUM TO BE DONE TODAY.  Testing/Procedures: NONE  Follow-Up: At [facility], you and your health needs are our priority.  As part of our continuing mission to provide you with exceptional heart care, our providers are all part of one team.  This team includes your primary Cardiologist (physician) and Advanced Practice Providers or APPs (Physician Assistants and Nurse Practitioners) who all work together to provide you with the care you need, when you need it.  Your next appointment:   9 MONTHS  Provider:   MADISON FOUNTAIN, NP

## 2024-11-15 NOTE — Progress Notes (Signed)
 Cardiology Office Note:    Date:  11/15/2024  ID:  Norma [name], DOB [birth year], MRN [id number] PCP: Alben Therisa MATSU, PA  [facility] Providers Cardiologist:  Dub Huntsman, DO Cardiology APP:  Rana Lum CROME, NP       Patient Profile:       Chief Complaint: 8-month follow-up History of Present Illness:  Norma [name] is a 63 y.o. female with visit-pertinent history of PSVT, type 2 diabetes, morbid obesity, hyperlipidemia, hypertension, history of COVID-19 infection now 2 episodes of COVID first in 2022 which led to chronic respiratory failure where she needed oxygen and was eventually titrated off oxygen, long COVID syndrome, OSA on CPAP   Patient established with cardiology service on 02/23/2022.  At that time she presented with chest discomfort.  Right and left cardiac catheterization was performed on 03/02/2022 showing normal right dominant circulation with no obstructive disease.  Normal LVEDP of 16 mmHg with preserved ejection fraction of 55-65%.  Echocardiogram performed on 03/07/2022 showing LVEF 60 to 65%, no RWMA, grade 1 DD, RV function and size normal, trivial MR.   She was seen for follow-up on 03/16/2022.  She noted to continue to experience chest pressure and shortness of breath.  In addition she was experiencing palpitations.  ZIO was ordered showing predominant underlying rhythm was sinus rhythm, 65 supraventricular tachycardia runs occurred, the run with the fastest interval lasting 16 beats with a max rate of 174 bpm; the run with the fastest interval was also the longest.  She was started on flecainide  50 mg twice daily.   Seen in office on 10/25/2023.  She presented with increase in PVCs.  In addition she reported high blood pressure readings at home particularly when she is experiencing PVCs.  Flecainide  was increased to 100 mg twice daily.  Repeat ZIO was ordered on 11/07/2023 showing 6 supraventricular tachycardia runs, the run with the fastest interval lasting 4  beats with a max rate of 150 bpm, the longest lasting 14.9 seconds with an average rate of 118 bpm.   Last seen in clinic on 04/18/2024.  She was doing at that time and no changes were made.   Discussed the use of AI scribe software for clinical note transcription with the patient, who gave verbal consent to proceed.  History of Present Illness Norma [name] is a 63 year old female with supraventricular tachycardia, PVCs, and sleep apnea who presents for a routine six-month follow-up.  Today she is doing well without any acute cardiovascular concerns or complaints.  She takes flecainide  100 mg twice daily and metoprolol  once daily, effectively controlling her symptoms. She rarely experiences tachycardia or palpitations now and has not required hospital or emergency department visits since the last appointment.  She denies any chest pains, dyspnea, orthopnea, PND, syncope, presyncope, lightheadedness or dizziness  She uses a CPAP machine almost daily for sleep apnea.   Her medications include amlodipine and valsartan for blood pressure management, with no reported issues. She does not monitor her heart rate at home but occasionally checks her oxygen levels with a pulse oximeter so she does have access to monitor her heart rates.   Review of systems:  Please see the history of present illness. All other systems are reviewed and otherwise negative.      Studies Reviewed:    EKG Interpretation Date/Time:  Friday November 15 2024 08:00:28 EST Ventricular Rate:  61 PR Interval:  158 QRS Duration:  102 QT Interval:  432 QTC Calculation: 434  R Axis:   -32  Text Interpretation: Normal sinus rhythm Left axis deviation Cannot rule out Anterior infarct , age undetermined When compared with ECG of 18-Apr-2024 10:24, No significant change was found Confirmed by Rana Dixon [phone]) on 11/15/2024 8:21:15 AM    ZIO 11/30/2019 Patch Wear Time:  13 days and 23 hours (2024-11-14T20:37:04-499  to 2024-11-28T20:37:12-498)   Patient had a min HR of 44 bpm, max HR of 150 bpm, and avg HR of 62 bpm. Predominant underlying rhythm was Sinus Rhythm.    6 Supraventricular Tachycardia runs occurred, the run with the fastest interval lasting 4 beats with a max rate of 150 bpm, the longest lasting 14.9 secs with an avg rate of 118 bpm. Supraventricular Tachycardia was detected within +/- 45 seconds of symptomatic patient event(s). Isolated SVEs were rare (<1.0%), SVE Couplets were rare (<1.0%), and SVE Triplets were rare (<1.0%). Isolated VEs were rare (<1.0%), VE Couplets were rare (<1.0%), and no VE Triplets were present. Ventricular Bigeminy was present.    ZIO 03/16/2022 Patch Wear Time:  14 days and 0 hours (2023-03-25T21:55:54-0400 to 2023-04-08T21:55:58-0400)   Patient had a min HR of 51 bpm, max HR of 174 bpm, and avg HR of 71 bpm. Predominant underlying rhythm was Sinus Rhythm.    65 Supraventricular Tachycardia runs occurred, the run with the fastest interval lasting 16 beats with a max rate of 174 bpm (avg 138  bpm); the run with the fastest interval was also the longest. Supraventricular Tachycardia was detected within +/- 45 seconds of symptomatic patient event(s). Isolated SVEs were rare (<1.0%), SVE Couplets were rare (<1.0%), and SVE Triplets were rare (<1.0%). Isolated VEs were rare (<1.0%, 1389), VE Couplets were rare (<1.0%, 8), and VE Triplets were rare (<1.0%, 1). Ventricular Bigeminy was present.    Right/left cardiac catheterization 03/02/2022   LV end diastolic pressure is normal.   LV end diastolic pressure is normal.   The left ventricular ejection fraction is 55-65% by visual estimate.   1.  Normal right dominant circulation with no obstructive disease. 2.  Normal LVEDP of 16 mmHg with preserved ejection fraction. 3.  Normal cardiac output of 5.96 L/min with an index of 3.0 L/min/m with mean RA pressure of 5 mmHg, mean wedge pressure of 8 mmHg, and PA saturation of  72%. Diagnostic Dominance: Right   Risk Assessment/Calculations:              Physical Exam:   VS:  BP 112/70 (BP Location: Left Arm, Patient Position: Sitting, Cuff Size: Large)   Pulse 61   Ht 5' 2 (1.575 m)   Wt 204 lb (92.5 kg)   LMP  (LMP Unknown)   BMI 37.31 kg/m    Wt Readings from Last 3 Encounters:  11/15/24 204 lb (92.5 kg)  04/18/24 202 lb (91.6 kg)  03/28/24 204 lb (92.5 kg)    GEN: Well nourished, well developed in no acute distress NECK: No JVD; No carotid bruits CARDIAC: RRR, no murmurs, rubs, gallops RESPIRATORY:  Clear to auscultation without rales, wheezing or rhonchi  ABDOMEN: Soft, non-tender, non-distended EXTREMITIES:  No edema; No acute deformity      Assessment and Plan:  Paroxysmal supraventricular tachycardia ZIO 02/2022 showed 65 SVT runs occurred Repeat ZIO 10/2023 showed 6 SVT runs occurred - Quiescent and well-controlled on current regimen.  Rarely experiences episodes of tachycardia - Heart rate today of 61 with no episodes of bradycardia.  She has a pulse oximeter at home to monitor - Continue metoprolol   XL 100 mg daily and flecainide  100 mg twice daily - EKG today without widened QRS or prolonged PR - BMET, LFTs, MAG today   Rare PVC ZIO 03/14/2022 and 11/15/2023 with PVC burden less than 1% Well-controlled on flecainide  100 mg twice daily and metoprolol  XL 100 mg daily - Rarely experiences palpitations.  EKG today is without PVCs - Continue current medication regimen   Hypertension Blood pressure today is under excellent control at 112/70 - Continue amlodipine 5 mg daily, metoprolol  XL 100 mg daily, and valsartan 320 mg daily   History of atypical chest pain History of costochondritis in the past Left cardiac catheterization 02/2022 with normal coronaries - Today she is stable without chest pains.  No indication for ischemic evaluation  Obstructive sleep apnea - Remains adherent to CPAP therapy      Dispo:  Return in about 9  months (around 08/15/2025).  Signed, Lum LITTIE Louis, NP

## 2024-11-16 LAB — COMPREHENSIVE METABOLIC PANEL WITH GFR
ALT: 71 IU/L — ABNORMAL HIGH (ref 0–32)
AST: 49 IU/L — ABNORMAL HIGH (ref 0–40)
Albumin: 4.6 g/dL (ref 3.9–4.9)
Alkaline Phosphatase: 87 IU/L (ref 49–135)
BUN/Creatinine Ratio: 19 (ref 12–28)
BUN: 10 mg/dL (ref 8–27)
Bilirubin Total: 0.8 mg/dL (ref 0.0–1.2)
CO2: 21 mmol/L (ref 20–29)
Calcium: 9.8 mg/dL (ref 8.7–10.3)
Chloride: 100 mmol/L (ref 96–106)
Creatinine, Ser: 0.52 mg/dL — ABNORMAL LOW (ref 0.57–1.00)
Globulin, Total: 2.3 g/dL (ref 1.5–4.5)
Glucose: 136 mg/dL — ABNORMAL HIGH (ref 70–99)
Potassium: 4.2 mmol/L (ref 3.5–5.2)
Sodium: 141 mmol/L (ref 134–144)
Total Protein: 6.9 g/dL (ref 6.0–8.5)
eGFR: 104 mL/min/1.73 (ref >59)

## 2024-11-16 LAB — MAGNESIUM: Magnesium: 1.9 mg/dL (ref 1.6–2.3)

## 2024-11-20 ENCOUNTER — Ambulatory Visit: Payer: Self-pay | Admitting: Emergency Medicine

## 2024-11-20 DIAGNOSIS — Z79899 Other long term (current) drug therapy: Secondary | ICD-10-CM

## 2025-01-02 ENCOUNTER — Encounter: Admitting: Rheumatology

## 2025-01-22 ENCOUNTER — Other Ambulatory Visit: Payer: Self-pay | Admitting: Emergency Medicine

## 2025-02-04 ENCOUNTER — Ambulatory Visit: Admitting: Rheumatology
